# Patient Record
Sex: Female | Born: 1970 | Race: White | Hispanic: No | Marital: Married | Smoking: Former smoker
Health system: Southern US, Community
[De-identification: ages and names within clinical notes are randomized; demographics above are authoritative.]

## PROBLEM LIST (undated history)

## (undated) DIAGNOSIS — R928 Other abnormal and inconclusive findings on diagnostic imaging of breast: Secondary | ICD-10-CM

## (undated) DIAGNOSIS — F329 Major depressive disorder, single episode, unspecified: Secondary | ICD-10-CM

## (undated) DIAGNOSIS — K635 Polyp of colon: Secondary | ICD-10-CM

## (undated) DIAGNOSIS — N83209 Unspecified ovarian cyst, unspecified side: Secondary | ICD-10-CM

## (undated) DIAGNOSIS — E785 Hyperlipidemia, unspecified: Secondary | ICD-10-CM

## (undated) DIAGNOSIS — F32A Depression, unspecified: Secondary | ICD-10-CM

## (undated) HISTORY — DX: Unspecified ovarian cyst, unspecified side: N83.209

## (undated) HISTORY — PX: COLONOSCOPY: SHX174

## (undated) HISTORY — DX: Polyp of colon: K63.5

## (undated) HISTORY — DX: Other abnormal and inconclusive findings on diagnostic imaging of breast: R92.8

## (undated) HISTORY — DX: Depression, unspecified: F32.A

## (undated) HISTORY — DX: Major depressive disorder, single episode, unspecified: F32.9

## (undated) HISTORY — DX: Hyperlipidemia, unspecified: E78.5

---

## 2007-01-26 ENCOUNTER — Emergency Department: Payer: Self-pay | Admitting: Internal Medicine

## 2007-02-10 ENCOUNTER — Ambulatory Visit: Payer: Self-pay | Admitting: Unknown Physician Specialty

## 2007-12-19 ENCOUNTER — Ambulatory Visit: Payer: Self-pay | Admitting: Gastroenterology

## 2008-05-10 DIAGNOSIS — R928 Other abnormal and inconclusive findings on diagnostic imaging of breast: Secondary | ICD-10-CM

## 2008-05-10 HISTORY — DX: Other abnormal and inconclusive findings on diagnostic imaging of breast: R92.8

## 2008-11-27 ENCOUNTER — Ambulatory Visit: Payer: Self-pay | Admitting: Family Medicine

## 2010-12-09 ENCOUNTER — Ambulatory Visit: Payer: Self-pay | Admitting: Family Medicine

## 2016-03-05 ENCOUNTER — Ambulatory Visit (INDEPENDENT_AMBULATORY_CARE_PROVIDER_SITE_OTHER): Payer: 59

## 2016-03-05 ENCOUNTER — Ambulatory Visit
Admission: EM | Admit: 2016-03-05 | Discharge: 2016-03-05 | Disposition: A | Discharge status: Home / Self Care | Payer: 59 | Attending: Family Medicine | Admitting: Family Medicine

## 2016-03-05 ENCOUNTER — Encounter: Payer: Self-pay | Admitting: Emergency Medicine

## 2016-03-05 DIAGNOSIS — S92352A Displaced fracture of fifth metatarsal bone, left foot, initial encounter for closed fracture: Secondary | ICD-10-CM | POA: Diagnosis not present

## 2016-03-05 DIAGNOSIS — S92422A Displaced fracture of distal phalanx of left great toe, initial encounter for closed fracture: Secondary | ICD-10-CM | POA: Diagnosis not present

## 2016-03-05 NOTE — ED Triage Notes (Signed)
Patient c/o pain in her in left 1st toe after hitting her toe last night.

## 2016-03-05 NOTE — ED Provider Notes (Signed)
[facility]    CSN: [id number] Arrival date & time: 03/05/16  1000     History   Chief Complaint Chief Complaint  Patient presents with  . Toe Injury    HPI Shelby Odonnell is a 45 y.o. female.   Patient states that she tripped over her dog last night she was recently dog back to the house. Dr. Lennette Bihari she tripped and fell striking her left big toe on the brake steps. States the toes gotten so swollen and is bruised and very painful. This happened last night. Staff had broken toe before. She does smoke no other medical problems. She's had a rash when she took doxycycline before in the past. No pertinent family medical history pertaining to today's visit.   The history is provided by the patient. No language interpreter was used.  Leg Pain  Location:  Toe Time since incident:  1 day Injury: yes   Mechanism of injury: fall   Fall:    Fall occurred:  Running   Impact surface:  Stairs (brick step)   Entrapped after fall: no   Toe location:  L great toe Pain details:    Quality:  Aching   Radiates to:  Does not radiate   Severity:  Moderate   Onset quality:  Sudden   Timing:  Constant Chronicity:  New Dislocation: no   Foreign body present:  No foreign bodies Prior injury to area:  Yes   History reviewed. No pertinent past medical history.  There are no active problems to display for this patient.   Past Surgical History:  Procedure Laterality Date  . CESAREAN SECTION      OB History    No data available       Home Medications    Prior to Admission medications   Medication Sig Start Date End Date Taking? Authorizing Provider  norethindrone-ethinyl estradiol (JUNEL FE,GILDESS FE,LOESTRIN FE) 1-20 MG-MCG tablet Take 1 tablet by mouth daily.   Yes Historical Provider, MD  pravastatin (PRAVACHOL) 10 MG tablet Take 10 mg by mouth daily.   Yes Historical Provider, MD  venlafaxine (EFFEXOR) 75 MG tablet Take 75 mg by mouth 2 (two) times daily.    Yes Historical Provider, MD    Family History Family History  Problem Relation Age of Onset  . Colon cancer Father     Social History Social History  Substance Use Topics  . Smoking status: Current Every Day Smoker    Packs/day: 0.50    Types: Cigarettes  . Smokeless tobacco: Never Used  . Alcohol use Yes     Allergies   Tetracyclines & related   Review of Systems Review of Systems  Musculoskeletal: Positive for joint swelling and myalgias.  All other systems reviewed and are negative.    Physical Exam Triage Vital Signs ED Triage Vitals  Enc Vitals Group     BP 03/05/16 1014 132/65     Pulse Rate 03/05/16 1014 94     Resp 03/05/16 1014 16     Temp 03/05/16 1014 98.5 F (36.9 C)     Temp Source 03/05/16 1014 Oral     SpO2 03/05/16 1014 100 %     Weight 03/05/16 1012 170 lb (77.1 kg)     Height 03/05/16 1012 5\' 4"  (1.626 m)     Head Circumference --      Peak Flow --      Pain Score 03/05/16 1015 8     Pain Loc --  Pain Edu? --      Excl. in GC? --    No data found.   Updated Vital Signs BP 132/65 (BP Location: Left Arm)   Pulse 94   Temp 98.5 F (36.9 C) (Oral)   Resp 16   Ht 5\' 4"  (1.626 m)   Wt 170 lb (77.1 kg)   LMP 02/24/2016 (Exact Date) Comment: denies preg  SpO2 100%   BMI 29.18 kg/m   Visual Acuity Right Eye Distance:   Left Eye Distance:   Bilateral Distance:    Right Eye Near:   Left Eye Near:    Bilateral Near:     Physical Exam  Constitutional: She is oriented to person, place, and time. She appears well-developed and well-nourished.  HENT:  Head: Normocephalic and atraumatic.  Eyes: Pupils are equal, round, and reactive to light.  Neck: Neck supple.  Pulmonary/Chest: Breath sounds normal.  Musculoskeletal: Normal range of motion. She exhibits tenderness.  Neurological: She is alert and oriented to person, place, and time. She has normal reflexes. No cranial nerve deficit.  Skin: There is erythema.  Psychiatric: She  has a normal mood and affect.  Vitals reviewed.    UC Treatments / Results  Labs (all labs ordered are listed, but only abnormal results are displayed) Labs Reviewed - No data to display  EKG  EKG Interpretation None       Radiology Dg Toe Great Left  Result Date: 03/05/2016 CLINICAL DATA:  Left great toe injury. EXAM: LEFT GREAT TOE COMPARISON:  None. FINDINGS: Minimally displaced fracture noted along the lateral aspect of the base of the distal phalanx of the left great toe. Very subtle nondisplaced fracture at the medial base of the left first metatarsal cannot be completely excluded. Diffuse degenerative change. No radiopaque foreign body. IMPRESSION: 1. Minimally displaced fracture noted at the lateral base of the distal phalanx of the left great toe. 2. Very subtle nondisplaced fracture at the medial base of the left first metatarsal cannot be completely excluded. Electronically Signed   By: Maisie Fushomas  Register   On: 03/05/2016 11:00    Procedures Procedures (including critical care time)  Medications Ordered in UC Medications - No data to display   Initial Impression / Assessment and Plan / UC Course  I have reviewed the triage vital signs and the nursing notes.  Pertinent labs & imaging results that were available during my care of the patient were reviewed by me and considered in my medical decision making (see chart for details).  Clinical Course   X-ray L great toe. Patient with a distal phalanx fracture and a distal and a proximal metatarsal fracture. We'll have her wear a boot but he take both toes recommend following her podiatrist Dr. Ether GriffinsFowler. Work note given for today as well. Offers shot of Toradol she declines states she had Advil at home  Final Clinical Impressions(s) / UC Diagnoses   Final diagnoses:  Closed non-physeal fracture of fifth metatarsal bone of left foot, initial encounter  Displaced fracture of distal phalanx of left great toe, initial  encounter for closed fracture    New Prescriptions New Prescriptions   No medications on file     Note: This dictation was prepared with Dragon dictation along with smaller phrase technology. Any transcriptional errors that result from this process are unintentional.   Hassan RowanEugene Anyia Gierke, MD 03/05/16 1140

## 2016-03-09 ENCOUNTER — Telehealth: Payer: Self-pay

## 2016-03-09 NOTE — Telephone Encounter (Signed)
Courtesy call back completed today after patients visit at [facility]. Patient improved and will follow up with their PCP if symptoms continue or worsen.   

## 2016-07-26 ENCOUNTER — Encounter: Payer: Self-pay | Admitting: Emergency Medicine

## 2016-07-26 ENCOUNTER — Observation Stay
Admission: EM | Admit: 2016-07-26 | Discharge: 2016-07-28 | Disposition: A | Discharge status: Other Acute Inpatient Hospital | Payer: 59 | Attending: Internal Medicine | Admitting: Internal Medicine

## 2016-07-26 DIAGNOSIS — F17210 Nicotine dependence, cigarettes, uncomplicated: Secondary | ICD-10-CM | POA: Diagnosis not present

## 2016-07-26 DIAGNOSIS — F332 Major depressive disorder, recurrent severe without psychotic features: Secondary | ICD-10-CM | POA: Insufficient documentation

## 2016-07-26 DIAGNOSIS — F1010 Alcohol abuse, uncomplicated: Secondary | ICD-10-CM | POA: Diagnosis not present

## 2016-07-26 DIAGNOSIS — T43212A Poisoning by selective serotonin and norepinephrine reuptake inhibitors, intentional self-harm, initial encounter: Principal | ICD-10-CM | POA: Insufficient documentation

## 2016-07-26 DIAGNOSIS — E785 Hyperlipidemia, unspecified: Secondary | ICD-10-CM | POA: Diagnosis not present

## 2016-07-26 DIAGNOSIS — Z79899 Other long term (current) drug therapy: Secondary | ICD-10-CM | POA: Insufficient documentation

## 2016-07-26 DIAGNOSIS — F19921 Other psychoactive substance use, unspecified with intoxication with delirium: Secondary | ICD-10-CM | POA: Insufficient documentation

## 2016-07-26 DIAGNOSIS — R569 Unspecified convulsions: Secondary | ICD-10-CM | POA: Diagnosis not present

## 2016-07-26 DIAGNOSIS — F05 Delirium due to known physiological condition: Secondary | ICD-10-CM | POA: Insufficient documentation

## 2016-07-26 DIAGNOSIS — T43202A Poisoning by unspecified antidepressants, intentional self-harm, initial encounter: Secondary | ICD-10-CM

## 2016-07-26 DIAGNOSIS — T50902A Poisoning by unspecified drugs, medicaments and biological substances, intentional self-harm, initial encounter: Secondary | ICD-10-CM

## 2016-07-26 DIAGNOSIS — J45909 Unspecified asthma, uncomplicated: Secondary | ICD-10-CM | POA: Diagnosis not present

## 2016-07-26 LAB — CBC WITH DIFFERENTIAL/PLATELET
BASOS ABS: 0.1 10*3/uL (ref 0–0.1)
Basophils Relative: 1 %
EOS PCT: 1 %
Eosinophils Absolute: 0.1 10*3/uL (ref 0–0.7)
HCT: 37 % (ref 35.0–47.0)
Hemoglobin: 13.2 g/dL (ref 12.0–16.0)
Lymphocytes Relative: 26 %
Lymphs Abs: 2.9 10*3/uL (ref 1.0–3.6)
MCH: 32.7 pg (ref 26.0–34.0)
MCHC: 35.5 g/dL (ref 32.0–36.0)
MCV: 92 fL (ref 80.0–100.0)
MONO ABS: 1 10*3/uL — AB (ref 0.2–0.9)
Monocytes Relative: 9 %
NEUTROS ABS: 7 10*3/uL — AB (ref 1.4–6.5)
Neutrophils Relative %: 63 %
PLATELETS: 302 10*3/uL (ref 150–440)
RBC: 4.02 MIL/uL (ref 3.80–5.20)
RDW: 13.7 % (ref 11.5–14.5)
WBC: 11 10*3/uL (ref 3.6–11.0)

## 2016-07-26 LAB — URINE DRUG SCREEN, QUALITATIVE (ARMC ONLY)
Amphetamines, Ur Screen: NOT DETECTED
Barbiturates, Ur Screen: NOT DETECTED
Benzodiazepine, Ur Scrn: NOT DETECTED
CANNABINOID 50 NG, UR ~~LOC~~: NOT DETECTED
Cocaine Metabolite,Ur ~~LOC~~: NOT DETECTED
MDMA (ECSTASY) UR SCREEN: NOT DETECTED
Methadone Scn, Ur: NOT DETECTED
Opiate, Ur Screen: NOT DETECTED
PHENCYCLIDINE (PCP) UR S: NOT DETECTED
Tricyclic, Ur Screen: NOT DETECTED

## 2016-07-26 LAB — URINALYSIS, COMPLETE (UACMP) WITH MICROSCOPIC
BACTERIA UA: NONE SEEN
Bilirubin Urine: NEGATIVE
Glucose, UA: NEGATIVE mg/dL
Ketones, ur: NEGATIVE mg/dL
Leukocytes, UA: NEGATIVE
Nitrite: NEGATIVE
PROTEIN: NEGATIVE mg/dL
SQUAMOUS EPITHELIAL / LPF: NONE SEEN
Specific Gravity, Urine: 1.001 — ABNORMAL LOW (ref 1.005–1.030)
WBC UA: NONE SEEN WBC/hpf (ref 0–5)
pH: 7 (ref 5.0–8.0)

## 2016-07-26 LAB — COMPREHENSIVE METABOLIC PANEL
ALBUMIN: 3.9 g/dL (ref 3.5–5.0)
ALT: 24 U/L (ref 14–54)
AST: 26 U/L (ref 15–41)
Alkaline Phosphatase: 53 U/L (ref 38–126)
Anion gap: 7 (ref 5–15)
BUN: 10 mg/dL (ref 6–20)
CHLORIDE: 106 mmol/L (ref 101–111)
CO2: 24 mmol/L (ref 22–32)
Calcium: 8.5 mg/dL — ABNORMAL LOW (ref 8.9–10.3)
Creatinine, Ser: 0.73 mg/dL (ref 0.44–1.00)
GFR calc Af Amer: >60 mL/min (ref >60)
GFR calc non Af Amer: >60 mL/min (ref >60)
GLUCOSE: 88 mg/dL (ref 65–99)
POTASSIUM: 3.9 mmol/L (ref 3.5–5.1)
Sodium: 137 mmol/L (ref 135–145)
Total Bilirubin: 0.5 mg/dL (ref 0.3–1.2)
Total Protein: 6.6 g/dL (ref 6.5–8.1)

## 2016-07-26 LAB — ACETAMINOPHEN LEVEL

## 2016-07-26 LAB — SALICYLATE LEVEL

## 2016-07-26 NOTE — BH Assessment (Signed)
Assessment Note  Shelby Odonnell is an 46 y.o. female. Shelby Odonnell arrived to the ED by way of EMS.  She reports, "I took a bunch of pills, I have been really depressed".  Shelby Odonnell did not report a trigger for her depression.  She stated that she has been feeling depressed off and on for approximately 1 year.  She reports that she worries a lot, has crying spells, has feelings of feeling worthless, and uses a lot of negative self talk.  She reports that when she was taking the pills, she shared that she felt that she was doing everyone a favor by killing herself and not being a burden to everyone.  Shelby Odonnell denied having symptoms of anxiety.  She denied having auditory or visual hallucinations.  She denied having homicidal ideation or intent.  Currently Shelby Odonnell requires 24 hour medical observation due to the substances she ingested.   Diagnosis: Depression, SI  Past Medical History: History reviewed. No pertinent past medical history.  Past Surgical History:  Procedure Laterality Date  . CESAREAN SECTION      Family History:  Family History  Problem Relation Age of Onset  . Colon cancer Father     Social History:  reports that she has been smoking Cigarettes.  She has been smoking about 1.00 pack per day. She has never used smokeless tobacco. She reports that she drinks alcohol. She reports that she does not use drugs.  Additional Social History:  Alcohol / Drug Use History of alcohol / drug use?: Yes (Occassional use of alcohol) Substance #1 Name of Substance 1: Alcohol 1 - Age of First Use: 14 1 - Amount (size/oz): 2 mixed drinks 1 - Frequency: occassional 1 - Last Use / Amount: July 24, 2016  CIWA: CIWA-Ar BP: (!) 96/52 Pulse Rate: 92 COWS:    Allergies:  Allergies  Allergen Reactions  . Fluoxetine Other (See Comments)    Other Reaction: OTHER REACTION, onSet: 09/03/2009  . Tetracyclines & Related Rash    Home Medications:  (Not in a hospital  admission)  OB/GYN Status:  No LMP recorded.  General Assessment Data Location of Assessment: [facility] ED TTS Assessment: In system Is this a Tele or Face-to-Face Assessment?: Face-to-Face Is this an Initial Assessment or a Re-assessment for this encounter?: Initial Assessment Marital status: Married Shelby Odonnell Is patient pregnant?: No Pregnancy Status: No Living Arrangements: Spouse/significant other, Children Can pt return to current living arrangement?: Yes Admission Status: Involuntary Is patient capable of signing voluntary admission?: Yes Referral Source: Self/Family/Friend Insurance type: [facility]  Medical Screening Exam [facility] Walk-in ONLY) Medical Exam completed: Yes  Crisis Care Plan Living Arrangements: Spouse/significant other, Children Legal Guardian: Other: (Self) Name of Psychiatrist: None at this time Name of Therapist: None at this time  Education Status Is patient currently in school?: No Current Grade: n/a Highest grade of school patient has completed: 12th Name of school: [employer] manager person: n/a  Risk to self with the past 6 months Suicidal Ideation: Yes-Currently Present Has patient been a risk to self within the past 6 months prior to admission? : Yes Suicidal Intent: Yes-Currently Present Has patient had any suicidal intent within the past 6 months prior to admission? : Yes Is patient at risk for suicide?:  (Currently in the hospital) Suicidal Plan?: No Has patient had any suicidal plan within the past 6 months prior to admission? : Yes Access to Means: Yes Specify Access to Suicidal Means: Use of medication she has  What has been your use of drugs/alcohol within the last 12 months?: occassional use of alcohol Previous Attempts/Gestures: No How many times?: 0 Other Self Harm Risks: denied Triggers for Past Attempts: None known Intentional Self Injurious Behavior: None (history of cutting when she was a teenager) Family Suicide  History: No Recent stressful life event(s):  (denied) Persecutory voices/beliefs?: No Depression: Yes Depression Symptoms: Despondent, Tearfulness, Feeling worthless/self pity Substance abuse history and/or treatment for substance abuse?: No Suicide prevention information given to non-admitted patients: Not applicable  Risk to Others within the past 6 months Homicidal Ideation: No Does patient have any lifetime risk of violence toward others beyond the six months prior to admission? : No Thoughts of Harm to Others: No Current Homicidal Intent: No Current Homicidal Plan: No Access to Homicidal Means: No Identified Victim: None identified History of harm to others?: No Assessment of Violence: None Noted Violent Behavior Description: denied Does patient have access to weapons?: No Criminal Charges Pending?: No Does patient have a court date: No Is patient on probation?: No  Psychosis Hallucinations: None noted Delusions: None noted  Mental Status Report Appearance/Hygiene: In scrubs Eye Contact: Fair Motor Activity: Unremarkable Speech: Logical/coherent, Soft Level of Consciousness: Alert Mood: Depressed Affect: Sad Anxiety Level: None Thought Processes: Coherent Judgement: Partial Orientation: Person, Place, Time, Situation Obsessive Compulsive Thoughts/Behaviors: None  Cognitive Functioning Concentration: Decreased Memory: Recent Intact IQ: Average Insight: Poor Impulse Control: Poor Appetite: Fair Sleep: No Change Vegetative Symptoms: None  ADLScreening [facility]) Patient's cognitive ability adequate to safely complete daily activities?: Yes Patient able to express need for assistance with ADLs?: Yes Independently performs ADLs?: Yes (appropriate for developmental age)  Prior Inpatient Therapy Prior Inpatient Therapy: No Prior Therapy Dates: n/a Prior Therapy Facilty/Provider(s): n/a Reason for Treatment: n/a  Prior Outpatient  Therapy Prior Outpatient Therapy: No Prior Therapy Dates: n/a Prior Therapy Facilty/Provider(s): n/a Reason for Treatment: n/a Does patient have an ACCT team?: No Does patient have Intensive In-House Services?  : No Does patient have [facility] services? : No Does patient have P4CC services?: No  ADL Screening (condition at time of admission) Patient's cognitive ability adequate to safely complete daily activities?: Yes Patient able to express need for assistance with ADLs?: Yes Independently performs ADLs?: Yes (appropriate for developmental age)       Abuse/Neglect Assessment (Assessment to be complete while patient is alone) Physical Abuse: Denies Verbal Abuse: Denies Sexual Abuse: Denies Exploitation of patient/patient's resources: Denies Self-Neglect: Denies     Merchant navy officerAdvance Directives (For Healthcare) Does Patient Have a Medical Advance Directive?: No    Additional Information 1:1 In Past 12 Months?: No CIRT Risk: No Elopement Risk: No Does patient have medical clearance?: No     Disposition:  Disposition Initial Assessment Completed for this Encounter: Yes Disposition of Patient: Inpatient treatment program  On Site Evaluation by:   Reviewed with Physician:    Justice DeedsKeisha Carmellia Kreisler 07/26/2016 11:47 PM

## 2016-07-26 NOTE — ED Notes (Signed)
[employer]

## 2016-07-26 NOTE — ED Notes (Signed)
Removed silver colored wedding band and silver colored brow piercing.  Gave to pts husband to take home per pts request.  Also gave husband a silver anklet with a wing charm, nose ring, and pts meds (4 bottles of Venlafaxine) to take home also per pts request.  Pt brought to room 20 via wheelchair.

## 2016-07-26 NOTE — ED Provider Notes (Signed)
[facility] Emergency Department Provider Note  ____________________________________________   First MD Initiated Contact with Patient 07/26/16 2144     (approximate)  I have reviewed the triage vital signs and the nursing notes.   HISTORY  Chief Complaint Drug Overdose   HPI Ronnie DossJudy G Krouse is a 46 y.o. female with a history of a "mood disorder" who is presenting to the emergency department with an intentional overdose. About an hour 50 minutes ago she took several handfuls of 150 mg extended release Effexor. She says that she did this on purpose and says that she would rather not go into the details of why she did at this time. She denies any other drugs or drinking. Says she has never had a suicide attempt in the past. Says that her husband called the police after the incident.   History reviewed. No pertinent past medical history.  There are no active problems to display for this patient.   Past Surgical History:  Procedure Laterality Date  . CESAREAN SECTION      Prior to Admission medications   Medication Sig Start Date End Date Taking? Authorizing Provider  albuterol (PROVENTIL HFA;VENTOLIN HFA) 108 (90 Base) MCG/ACT inhaler Inhale 2 puffs into the lungs daily as needed. 02/19/16 02/18/17 Yes Historical Provider, MD  ascorbic acid (VITAMIN C) 250 MG tablet Take 250 mg by mouth daily.   Yes Historical Provider, MD  norethindrone-ethinyl estradiol (JUNEL FE,GILDESS FE,LOESTRIN FE) 1-20 MG-MCG tablet Take 1 tablet by mouth daily.   Yes Historical Provider, MD  Omega-3 Fatty Acids (FISH OIL PO) Take 1 capsule by mouth daily.   Yes Historical Provider, MD  simvastatin (ZOCOR) 20 MG tablet Take 20 mg by mouth daily. 05/31/16  Yes Historical Provider, MD  venlafaxine (EFFEXOR) 75 MG tablet Take 75 mg by mouth 2 (two) times daily.   Yes Historical Provider, MD    Allergies Fluoxetine and Tetracyclines & related  Family History  Problem Relation Age  of Onset  . Colon cancer Father     Social History Social History  Substance Use Topics  . Smoking status: Current Every Day Smoker    Packs/day: 1.00    Types: Cigarettes  . Smokeless tobacco: Never Used  . Alcohol use Yes    Review of Systems Constitutional: No fever/chills Eyes: No visual changes. ENT: No sore throat. Cardiovascular: Denies chest pain. Respiratory: Denies shortness of breath. Gastrointestinal: No abdominal pain.  No nausea, no vomiting.  No diarrhea.  No constipation. Genitourinary: Negative for dysuria. Musculoskeletal: Negative for back pain. Skin: Negative for rash. Neurological: Negative for headaches, focal weakness or numbness.  10-point ROS otherwise negative.  ____________________________________________   PHYSICAL EXAM:  VITAL SIGNS: ED Triage Vitals [07/26/16 2139]  Enc Vitals Group     BP (!) 116/59     Pulse Rate 96     Resp 20     Temp 98.3 F (36.8 C)     Temp Source Oral     SpO2 100 %     Weight 167 lb (75.8 kg)     Height 5\' 5"  (1.651 m)     Head Circumference      Peak Flow      Pain Score      Pain Loc      Pain Edu?      Excl. in GC?     Constitutional: Alert and oriented. in no acute distress. Eyes: Conjunctivae are normal. PERRL. EOMI. Head: Atraumatic. Nose: No congestion/rhinnorhea. Mouth/Throat:  Mucous membranes are moist.   Neck: No stridor.   Cardiovascular: Normal rate, regular rhythm. Grossly normal heart sounds.   Respiratory: Normal respiratory effort.  No retractions. Lungs CTAB. Gastrointestinal: Soft and nontender. No distention.  Musculoskeletal: No lower extremity tenderness nor edema.  No joint effusions. Neurologic:  Normal speech and language. No gross focal neurologic deficits are appreciated.  Skin:  Skin is warm, dry and intact. No rash noted. Psychiatric: Tearful.   ____________________________________________   LABS (all labs ordered are listed, but only abnormal results are  displayed)  Labs Reviewed  ACETAMINOPHEN LEVEL - Abnormal; Notable for the following:       Result Value   Acetaminophen (Tylenol), Serum <10 (*)    All other components within normal limits  CBC WITH DIFFERENTIAL/PLATELET - Abnormal; Notable for the following:    Neutro Abs 7.0 (*)    Monocytes Absolute 1.0 (*)    All other components within normal limits  COMPREHENSIVE METABOLIC PANEL - Abnormal; Notable for the following:    Calcium 8.5 (*)    All other components within normal limits  URINALYSIS, COMPLETE (UACMP) WITH MICROSCOPIC - Abnormal; Notable for the following:    Color, Urine COLORLESS (*)    APPearance CLEAR (*)    Specific Gravity, Urine 1.001 (*)    Hgb urine dipstick SMALL (*)    All other components within normal limits  SALICYLATE LEVEL  URINE DRUG SCREEN, QUALITATIVE ([facility] ONLY)   ____________________________________________  EKG  ED ECG REPORT I, Arelia Longest, the attending physician, personally viewed and interpreted this ECG.   Date: 07/26/2016  EKG Time: 1132  Rate: 92  Rhythm: normal sinus rhythm  Axis: Normal  Intervals:none  ST&T Change: No ST segment elevation or depression. No abnormal T-wave inversion.  ____________________________________________  RADIOLOGY   ____________________________________________   PROCEDURES  Procedure(s) performed:   Procedures  Critical Care performed:   ____________________________________________   INITIAL IMPRESSION / ASSESSMENT AND PLAN / ED COURSE  Pertinent labs & imaging results that were available during my care of the patient were reviewed by me and considered in my medical decision making (see chart for details).  ----------------------------------------- 945 PM on 07/26/2016 ----------------------------------------- Discussed case with the [facility] [place], who recommends monitoring as well as every 6 EKGs. She recommends bicarbonate boluses for a prolonged QRS or  prolonged QT. Patient to be involuntarily committed. Patient is aware of this plan and is cooperating at this time. Patient ingested the medications are verapamil ago. Poison control did not recommend activated charcoal.  ----------------------------------------------------------- ----------------------------------------- 11:34 PM on 07/26/2016 -----------------------------------------   Patient still mentating awake and alert. Satisfactory vital signs at this time. She'll be transferred to the quad area of the emergency department and will be kept on the cardiac monitor. Signed out to Dr. Zenda Alpers for continued monitoring of the patient's condition.     ____________________________________________   FINAL CLINICAL IMPRESSION(S) / ED DIAGNOSES  Intentional overdose. Venlafaxine overdose.    NEW MEDICATIONS STARTED DURING THIS VISIT:  New Prescriptions   No medications on file     Note:  This document was prepared using Dragon voice recognition software and may include unintentional dictation errors.    Myrna Blazer, MD 07/26/16 [phone]

## 2016-07-26 NOTE — ED Triage Notes (Signed)
Pt presents to ED 05 c/o intentional drug overdose of unknown amount of anti-depressant medication; per EMS the pt's initial BP was 141/72, with HR of 108-110; on the way to ED HR and BP were 122/70 and HR in low 90s; pt states "not answering that" when asked about suicidal ideation. Per EMS, pt was locked in a room for about half an hour and he had to break the door down to get to her; pt is awake, alert and oriented x4; pts VS at this time are HR 96, O2 100% on RA, RR 20, BP 116/59, temp 98.3

## 2016-07-27 ENCOUNTER — Observation Stay: Payer: 59

## 2016-07-27 ENCOUNTER — Encounter: Payer: Self-pay | Admitting: Radiology

## 2016-07-27 DIAGNOSIS — F332 Major depressive disorder, recurrent severe without psychotic features: Secondary | ICD-10-CM | POA: Diagnosis not present

## 2016-07-27 DIAGNOSIS — T43202A Poisoning by unspecified antidepressants, intentional self-harm, initial encounter: Secondary | ICD-10-CM

## 2016-07-27 DIAGNOSIS — T50902A Poisoning by unspecified drugs, medicaments and biological substances, intentional self-harm, initial encounter: Secondary | ICD-10-CM

## 2016-07-27 DIAGNOSIS — F19921 Other psychoactive substance use, unspecified with intoxication with delirium: Secondary | ICD-10-CM

## 2016-07-27 DIAGNOSIS — R569 Unspecified convulsions: Secondary | ICD-10-CM

## 2016-07-27 LAB — CBC
HCT: 34.3 % — ABNORMAL LOW (ref 35.0–47.0)
Hemoglobin: 12.1 g/dL (ref 12.0–16.0)
MCH: 33 pg (ref 26.0–34.0)
MCHC: 35.3 g/dL (ref 32.0–36.0)
MCV: 93.4 fL (ref 80.0–100.0)
PLATELETS: 271 10*3/uL (ref 150–440)
RBC: 3.67 MIL/uL — AB (ref 3.80–5.20)
RDW: 13.8 % (ref 11.5–14.5)
WBC: 11.2 10*3/uL — ABNORMAL HIGH (ref 3.6–11.0)

## 2016-07-27 LAB — BASIC METABOLIC PANEL
Anion gap: 6 (ref 5–15)
BUN: 10 mg/dL (ref 6–20)
CALCIUM: 8 mg/dL — AB (ref 8.9–10.3)
CHLORIDE: 110 mmol/L (ref 101–111)
CO2: 23 mmol/L (ref 22–32)
Creatinine, Ser: 0.56 mg/dL (ref 0.44–1.00)
GFR calc Af Amer: >60 mL/min (ref >60)
GFR calc non Af Amer: >60 mL/min (ref >60)
GLUCOSE: 68 mg/dL (ref 65–99)
POTASSIUM: 3.7 mmol/L (ref 3.5–5.1)
SODIUM: 139 mmol/L (ref 135–145)

## 2016-07-27 LAB — MAGNESIUM: MAGNESIUM: 1.9 mg/dL (ref 1.7–2.4)

## 2016-07-27 LAB — PHOSPHORUS: Phosphorus: 2.7 mg/dL (ref 2.5–4.6)

## 2016-07-27 LAB — TSH: TSH: 4.715 u[IU]/mL — ABNORMAL HIGH (ref 0.350–4.500)

## 2016-07-27 MED ORDER — LORAZEPAM 2 MG/ML IJ SOLN
2.0000 mg | Freq: Once | INTRAMUSCULAR | Status: AC
  Administered 2016-07-27: 2 mg via INTRAVENOUS

## 2016-07-27 MED ORDER — CHLORHEXIDINE GLUCONATE 0.12 % MT SOLN
15.0000 mL | Freq: Two times a day (BID) | OROMUCOSAL | Status: DC
  Administered 2016-07-27 – 2016-07-28 (×3): 15 mL via OROMUCOSAL
  Filled 2016-07-27 (×3): qty 15

## 2016-07-27 MED ORDER — ONDANSETRON HCL 4 MG PO TABS: 4.0000 mg | ORAL_TABLET | Freq: Four times a day (QID) | ORAL | Status: DC | PRN

## 2016-07-27 MED ORDER — SODIUM CHLORIDE 0.9 % IV SOLN
INTRAVENOUS | Status: DC
  Administered 2016-07-27 – 2016-07-28 (×4): via INTRAVENOUS

## 2016-07-27 MED ORDER — ORAL CARE MOUTH RINSE
15.0000 mL | Freq: Two times a day (BID) | OROMUCOSAL | Status: DC
  Administered 2016-07-27 – 2016-07-28 (×3): 15 mL via OROMUCOSAL

## 2016-07-27 MED ORDER — ACETAMINOPHEN 650 MG RE SUPP: 650.0000 mg | Freq: Four times a day (QID) | RECTAL | Status: DC | PRN

## 2016-07-27 MED ORDER — ACETAMINOPHEN 325 MG PO TABS: 650.0000 mg | ORAL_TABLET | Freq: Four times a day (QID) | ORAL | Status: DC | PRN

## 2016-07-27 MED ORDER — VITAMIN C 500 MG PO TABS
250.0000 mg | ORAL_TABLET | Freq: Every day | ORAL | Status: DC
  Administered 2016-07-27 – 2016-07-28 (×2): 250 mg via ORAL
  Filled 2016-07-27 (×2): qty 1

## 2016-07-27 MED ORDER — PNEUMOCOCCAL VAC POLYVALENT 25 MCG/0.5ML IJ INJ
0.5000 mL | INJECTION | INTRAMUSCULAR | Status: AC
  Administered 2016-07-28: 0.5 mL via INTRAMUSCULAR
  Filled 2016-07-27: qty 0.5

## 2016-07-27 MED ORDER — SENNOSIDES-DOCUSATE SODIUM 8.6-50 MG PO TABS: 1.0000 | ORAL_TABLET | Freq: Every evening | ORAL | Status: DC | PRN

## 2016-07-27 MED ORDER — IOPAMIDOL (ISOVUE-300) INJECTION 61%
75.0000 mL | Freq: Once | INTRAVENOUS | Status: AC | PRN
  Administered 2016-07-27: 75 mL via INTRAVENOUS

## 2016-07-27 MED ORDER — SIMVASTATIN 20 MG PO TABS
20.0000 mg | ORAL_TABLET | Freq: Every day | ORAL | Status: DC
  Administered 2016-07-27 – 2016-07-28 (×2): 20 mg via ORAL
  Filled 2016-07-27 (×2): qty 1

## 2016-07-27 MED ORDER — ALBUTEROL SULFATE (2.5 MG/3ML) 0.083% IN NEBU: 3.0000 mL | INHALATION_SOLUTION | Freq: Every day | RESPIRATORY_TRACT | Status: DC | PRN

## 2016-07-27 MED ORDER — LORAZEPAM 2 MG/ML IJ SOLN
2.0000 mg | INTRAMUSCULAR | Status: DC | PRN
  Administered 2016-07-27: 2 mg via INTRAVENOUS
  Filled 2016-07-27: qty 1

## 2016-07-27 MED ORDER — ENOXAPARIN SODIUM 40 MG/0.4ML ~~LOC~~ SOLN
40.0000 mg | SUBCUTANEOUS | Status: DC
  Administered 2016-07-27: 20:00:00 40 mg via SUBCUTANEOUS
  Filled 2016-07-27: qty 0.4

## 2016-07-27 MED ORDER — ONDANSETRON HCL 4 MG/2ML IJ SOLN
4.0000 mg | Freq: Four times a day (QID) | INTRAMUSCULAR | Status: DC | PRN
  Administered 2016-07-27 (×2): 4 mg via INTRAVENOUS
  Filled 2016-07-27 (×2): qty 2

## 2016-07-27 MED ORDER — SODIUM CHLORIDE 0.9% FLUSH: 3.0000 mL | Freq: Two times a day (BID) | INTRAVENOUS | Status: DC

## 2016-07-27 MED ORDER — OMEGA-3-ACID ETHYL ESTERS 1 G PO CAPS
1.0000 g | ORAL_CAPSULE | Freq: Every day | ORAL | Status: DC
  Administered 2016-07-27 – 2016-07-28 (×2): 1 g via ORAL
  Filled 2016-07-27 (×2): qty 1

## 2016-07-27 MED ORDER — SODIUM CHLORIDE 0.9 % IV BOLUS (SEPSIS)
1000.0000 mL | Freq: Once | INTRAVENOUS | Status: AC
  Administered 2016-07-27: 1000 mL via INTRAVENOUS

## 2016-07-27 NOTE — ED Provider Notes (Signed)
-----------------------------------------   3:33 AM on 07/27/2016 -----------------------------------------   Blood pressure (!) 99/53, pulse 88, temperature 98.3 F (36.8 C), temperature source Oral, resp. rate 20, height 5\' 5"  (1.651 m), weight 167 lb (75.8 kg), SpO2 98 %.  Assuming care from Dr. Pershing ProudSchaevitz.  In short, Shelby Odonnell is a 46 y.o. female with a chief complaint of Drug Overdose .  Refer to the original H&P for additional details.  The current plan of care is to monitor the patient on the cardiac monitor for 24 hours until psych can see the patient .  In the emergency department the patient had a seizure. It did not last very long time. She did receive 2 mg of IM Ativan but she did become tachycardic. Her repeat EKG did not show a prolonged QTC or widened QRS. I will give the patient a liter of normal saline and I will admit her to the hospitalist service for medical clearance for a drug overdose.     ED ECG REPORT I, Rebecka ApleyWebster,  Allison P, the attending physician, personally viewed and interpreted this ECG.   Date: 07/27/2016  EKG Time: 322  Rate: 117  Rhythm: sinus tachycardia  Axis: normal  Intervals:none  ST&T Change: none    Rebecka ApleyAllison P Webster, MD 07/27/16 [phone]

## 2016-07-27 NOTE — Progress Notes (Signed)
[facility] -  at [facility]   PATIENT NAME: Shelby Odonnell    MR#:  [id number]  DATE OF BIRTH:  1970-07-22  SUBJECTIVE:  CHIEF COMPLAINT:   Chief Complaint  Patient presents with  . Drug Overdose   - RN called me after patient had a seizure episode this morning - patient admitted with effexor overdose. Alert but confused- post ictal state now  REVIEW OF SYSTEMS:  Review of Systems  Unable to perform ROS: Mental status change    DRUG ALLERGIES:   Allergies  Allergen Reactions  . Fluoxetine Other (See Comments)    Other Reaction: OTHER REACTION, onSet: 09/03/2009  . Tetracyclines & Related Rash    VITALS:  Blood pressure (!) 98/55, pulse (!) 109, temperature 99.1 F (37.3 C), temperature source Oral, resp. rate 20, height 5\' 5"  (1.651 m), weight 75.8 kg (167 lb), SpO2 95 %.  PHYSICAL EXAMINATION:  Physical Exam  GENERAL:  46 y.o.-year-old patient lying in the bed with no acute distress. Appears confused EYES: Pupils equal, round, reactive to light and accommodation. No scleral icterus. Extraocular muscles intact.  HEENT: Head atraumatic, normocephalic. Oropharynx and nasopharynx clear.  NECK:  Supple, no jugular venous distention. No thyroid enlargement, no tenderness.  LUNGS: Normal breath sounds bilaterally, no wheezing, rales,rhonchi or crepitation. No use of accessory muscles of respiration. Decreased bibasilar breath sounds CARDIOVASCULAR: S1, S2 normal. No murmurs, rubs, or gallops.  ABDOMEN: Soft, nontender, nondistended. Bowel sounds present. No organomegaly or mass.  EXTREMITIES: No pedal edema, cyanosis, or clubbing.  NEUROLOGIC: Cranial nerves II through XII are intact. Muscle strength 5/5 in all extremities. Sensation intact. Gait not checked. Able to move all extremities in bed PSYCHIATRIC: The patient is alert , confused.  SKIN: No obvious rash, lesion, or ulcer.    LABORATORY PANEL:   CBC  Recent Labs Lab 07/27/16 0550  WBC  11.2*  HGB 12.1  HCT 34.3*  PLT 271   ------------------------------------------------------------------------------------------------------------------  Chemistries   Recent Labs Lab 07/26/16 2143 07/27/16 0550  NA 137 139  K 3.9 3.7  CL 106 110  CO2 24 23  GLUCOSE 88 68  BUN 10 10  CREATININE 0.73 0.56  CALCIUM 8.5* 8.0*  AST 26  --   ALT 24  --   ALKPHOS 53  --   BILITOT 0.5  --    ------------------------------------------------------------------------------------------------------------------  Cardiac Enzymes No results for input(s): TROPONINI in the last 168 hours. ------------------------------------------------------------------------------------------------------------------  RADIOLOGY:  No results found.  EKG:   Orders placed or performed during the hospital encounter of 07/26/16  . EKG 12-Lead  . EKG 12-Lead  . ED EKG  . ED EKG  . ED EKG  . ED EKG  . ED EKG  . ED EKG  . ED EKG  . EKG 12-Lead  . EKG 12-Lead  . EKG 12-Lead  . EKG 12-Lead  . EKG 12-Lead  . EKG 12-Lead    ASSESSMENT AND PLAN:   46 y/o M with PMH of Depression on effexor admitted with an intentional overdose of 150mg  effexor extended release tabs.  #1 Drug overdose- effexor overdose- extended release tabs - monitor for seziures and cardiac arrythmias per posion control- ingestion was around 9PM last night- needs 24hrs to clear from system and also need to monitor for seizures until then Continue tele monitoring EKG now- watch for QRS widening and QT prolongation, if QRS>156ms- order bicarb push and repeat EKG to see the response - major depression with drug overdose- psych  consulted  #2 Seizures- secondary to effexor overdose- Watch for QRS widening which predisposes the seizures Should improve after 24hrs of ingestion per poison control Hold off on anti epileptics, continue ativan prn Neurology consulted  #3 Asthma-stable, prn inhaler  #4 DVT Prophylaxis- on  lovenox     All the records are reviewed and case discussed with Care Management/Social Workerr. Management plans discussed with the patient, family and they are in agreement.  CODE STATUS: Full Code  TOTAL TIME TAKING CARE OF THIS PATIENT: 39 minutes.   POSSIBLE D/C IN 1 DAY, DEPENDING ON CLINICAL CONDITION.   Macedonio Scallon M.D on 07/27/2016 at 9:51 AM  Between 7am to 6pm - Pager - [phone]  After 6pm go to [email or web address] - Social research officer, governmentpassword [email or web address]  [facility] [place] [facility]  Office  [phone]  CC: Primary care physician; [facility]

## 2016-07-27 NOTE — Plan of Care (Signed)
Problem: Activity: Goal: Risk for activity intolerance will decrease Outcome: Progressing Seizure pads remain in place on side rails x 4

## 2016-07-27 NOTE — ED Notes (Signed)
Pt had seizure, generalize, jerking, twitching, retracted, staring, stiff, incontinent, drools, lasted about 3 minutes with postictal.

## 2016-07-27 NOTE — Consult Note (Signed)
Reason for Consult:Seizure Referring Physician: Nemiah Commander  CC: Seizure  HPI: Shelby Odonnell is an 46 y.o. female who is unable to remember enough to provide an accurate history.  All history obtained from the chart and husband.  Patient presented to the emergency room after she overdosed with 3 bottles of venlafaxine pills. Patient said she felt depressed and wanted to kill herself. She lacks energy, and has thoughts of hurting herself. Patient involuntarily committed in the emergency room. Patient was awake, alert and oriented to time place and person at admission.  Noted to have a seizure in the ED and has had two further since admission.  Seizures described as generalized tonic-clonic.  Last seizure was about 0830 this morning.     History reviewed. No pertinent past medical history.  Past Surgical History:  Procedure Laterality Date  . CESAREAN SECTION      Family History  Problem Relation Age of Onset  . Colon cancer Father     Social History:  reports that she has been smoking Cigarettes.  She has been smoking about 1.00 pack per day. She has never used smokeless tobacco. She reports that she drinks alcohol. She reports that she does not use drugs.  Allergies  Allergen Reactions  . Fluoxetine Other (See Comments)    Other Reaction: OTHER REACTION, onSet: 09/03/2009  . Tetracyclines & Related Rash    Medications:  I have reviewed the patient's current medications. Prior to Admission:  Prescriptions Prior to Admission  Medication Sig Dispense Refill Last Dose  . albuterol (PROVENTIL HFA;VENTOLIN HFA) 108 (90 Base) MCG/ACT inhaler Inhale 2 puffs into the lungs daily as needed.     Marland Kitchen ascorbic acid (VITAMIN C) 250 MG tablet Take 250 mg by mouth daily.     . norethindrone-ethinyl estradiol (JUNEL FE,GILDESS FE,LOESTRIN FE) 1-20 MG-MCG tablet Take 1 tablet by mouth daily.     . Omega-3 Fatty Acids (FISH OIL PO) Take 1 capsule by mouth daily.     . simvastatin (ZOCOR) 20 MG tablet  Take 20 mg by mouth daily.     Marland Kitchen venlafaxine (EFFEXOR) 75 MG tablet Take 75 mg by mouth 2 (two) times daily.      Scheduled: . chlorhexidine  15 mL Mouth Rinse BID  . enoxaparin (LOVENOX) injection  40 mg Subcutaneous Q24H  . mouth rinse  15 mL Mouth Rinse q12n4p  . omega-3 acid ethyl esters  1 g Oral Daily  . [START ON 07/28/2016] pneumococcal 23 valent vaccine  0.5 mL Intramuscular Tomorrow-1000  . simvastatin  20 mg Oral Daily  . ascorbic acid  250 mg Oral Daily    ROS: History obtained from the patient  General ROS: negative for - chills, fatigue, fever, night sweats, weight gain or weight loss Psychological ROS: poor memory, feels as if in a dream state Ophthalmic ROS: negative for - blurry vision, double vision, eye pain or loss of vision ENT ROS: negative for - epistaxis, nasal discharge, oral lesions, sore throat, tinnitus or vertigo Allergy and Immunology ROS: negative for - hives or itchy/watery eyes Hematological and Lymphatic ROS: negative for - bleeding problems, bruising or swollen lymph nodes Endocrine ROS: negative for - galactorrhea, hair pattern changes, polydipsia/polyuria or temperature intolerance Respiratory ROS: negative for - cough, hemoptysis, shortness of breath or wheezing Cardiovascular ROS: negative for - chest pain, dyspnea on exertion, edema or irregular heartbeat Gastrointestinal ROS: negative for - abdominal pain, diarrhea, hematemesis, nausea/vomiting or stool incontinence Genito-Urinary ROS: negative for - dysuria, hematuria, incontinence  or urinary frequency/urgency Musculoskeletal ROS: negative for - joint swelling or muscular weakness Neurological ROS: as noted in HPI Dermatological ROS: negative for rash and skin lesion changes  Physical Examination: Blood pressure (!) 98/55, pulse (!) 109, temperature 99.1 F (37.3 C), temperature source Oral, resp. rate 20, height 5\' 5"  (1.651 m), weight 75.8 kg (167 lb), SpO2 95 %.  HEENT-  Normocephalic, no  lesions, without obvious abnormality.  Normal external eye and conjunctiva.  Normal TM's bilaterally.  Normal auditory canals and external ears. Normal external nose, mucus membranes and septum.  Normal pharynx.  Right tongue laceration. Cardiovascular- S1, S2 normal, pulses palpable throughout   Lungs- chest clear, no wheezing, rales, normal symmetric air entry Abdomen- soft, non-tender; bowel sounds normal; no masses,  no organomegaly Extremities- no edema Lymph-no adenopathy palpable Musculoskeletal-no joint tenderness, deformity or swelling Skin-warm and dry, no hyperpigmentation, vitiligo, or suspicious lesions  Neurological Examination   Mental Status: Alert, confused.  Word finding difficulties.  Able to follow 3 step commands with some minor reinforcement. Cranial Nerves: II: Discs flat bilaterally; Visual fields grossly normal, pupils equal, round, reactive to light and accommodation III,IV, VI: ptosis not present, extra-ocular motions intact bilaterally with some nystagmus noted.   V,VII: smile symmetric, facial light touch sensation normal bilaterally VIII: hearing normal bilaterally IX,X: gag reflex present XI: bilateral shoulder shrug XII: midline tongue extension Motor: Right : Upper extremity   5/5    Left:     Upper extremity   5/5  Lower extremity   5/5     Lower extremity   5/5 Tone and bulk:normal tone throughout; no atrophy noted.  Bilateral tremor noted in all extremities Sensory: Pinprick and light touch intact throughout, bilaterally Deep Tendon Reflexes: 3+ and symmetric throughout Plantars: Right: downgoing   Left: downgoing Cerebellar: Normal finger-to-nose and normal heel-to-shin testing bilaterally Gait: not tested due to safety concerns    Laboratory Studies:   Basic Metabolic Panel:  Recent Labs Lab 07/26/16 2143 07/27/16 0550  NA 137 139  K 3.9 3.7  CL 106 110  CO2 24 23  GLUCOSE 88 68  BUN 10 10  CREATININE 0.73 0.56  CALCIUM 8.5* 8.0*     Liver Function Tests:  Recent Labs Lab 07/26/16 2143  AST 26  ALT 24  ALKPHOS 53  BILITOT 0.5  PROT 6.6  ALBUMIN 3.9   No results for input(s): LIPASE, AMYLASE in the last 168 hours. No results for input(s): AMMONIA in the last 168 hours.  CBC:  Recent Labs Lab 07/26/16 2143 07/27/16 0550  WBC 11.0 11.2*  NEUTROABS 7.0*  --   HGB 13.2 12.1  HCT 37.0 34.3*  MCV 92.0 93.4  PLT 302 271    Cardiac Enzymes: No results for input(s): CKTOTAL, CKMB, CKMBINDEX, TROPONINI in the last 168 hours.  BNP: Invalid input(s): POCBNP  CBG: No results for input(s): GLUCAP in the last 168 hours.  Microbiology: No results found for this or any previous visit.  Coagulation Studies: No results for input(s): LABPROT, INR in the last 72 hours.  Urinalysis:  Recent Labs Lab 07/26/16 2143  COLORURINE COLORLESS*  LABSPEC 1.001*  PHURINE 7.0  GLUCOSEU NEGATIVE  HGBUR SMALL*  BILIRUBINUR NEGATIVE  KETONESUR NEGATIVE  PROTEINUR NEGATIVE  NITRITE NEGATIVE  LEUKOCYTESUR NEGATIVE    Lipid Panel:  No results found for: CHOL, TRIG, HDL, CHOLHDL, VLDL, LDLCALC  HgbA1C: No results found for: HGBA1C  Urine Drug Screen:     Component Value Date/Time   LABOPIA NONE  DETECTED 07/26/2016 2143   COCAINSCRNUR NONE DETECTED 07/26/2016 2143   LABBENZ NONE DETECTED 07/26/2016 2143   AMPHETMU NONE DETECTED 07/26/2016 2143   THCU NONE DETECTED 07/26/2016 2143   LABBARB NONE DETECTED 07/26/2016 2143    Alcohol Level: No results for input(s): ETH in the last 168 hours.  Other results: EKG: sinus tachycardia at 108 bpm.  Imaging: No results found.   Assessment/Plan: 46 year old female presenting after a Venlafaxine overdose.  Has had three seizures since admission.  Venlafaxine can clearly be the etiology for her seizures particularly in the high doses.  Would consider seizures provoked at this time.  Would look for other possible predisposing factors as well.     Recommendations: 1.  Head CT with and without contrast 2.  Serum magnesium and phosphorus 3.  EEG 4.  Ativan prn seizure activity 5.  Seizure precautions  Thana Farr, MD Neurology [phone] 07/27/2016, 1:12 PM

## 2016-07-27 NOTE — ED Notes (Addendum)
Received a call from Onalee Huaavid from poison control, checking on pt; asked for vital signs, urine drug screen, ASA/tylenol levels, and general status of the pt. Information provided.

## 2016-07-27 NOTE — ED Notes (Signed)
Changed pt into clean scrubs, clean bed sheets, and a clean blanket. Pt resting at this time.

## 2016-07-27 NOTE — H&P (Addendum)
[facility] - [place] at [facility]   PATIENT NAME: Shelby Odonnell    MR#:  [id number]  DATE OF BIRTH:  11/27/1970  DATE OF ADMISSION:  07/26/2016  PRIMARY CARE PHYSICIAN: [facility]   REQUESTING/REFERRING PHYSICIAN:   CHIEF COMPLAINT:   Chief Complaint  Patient presents with  . Drug Overdose    HISTORY OF PRESENT ILLNESS: Shelby Odonnell  is a 46 y.o. female with No significant past medical history presented to the emergency room after she overdosed with 3 bottles of venlafaxine pills. Patient said she felt depressed and wanted to kill herself. She lacks energy, and has thoughts of hurting herself. Patient involuntarily committed in the emergency room. Poison control was contacted by the ER physician who recommended cardiac monitoring and EKG every 6 hourly. Patient is awake, alert and oriented to time place and person. No complaints of any chest pain, shortness of breath and palpitations.Patient also had a seizure in emergency room. Hospitalist service was consulted for further care of the patient.  PAST MEDICAL HISTORY:  History reviewed. No pertinent past medical history.  No history of cad, hypertension, diabetes.  PAST SURGICAL HISTORY: Past Surgical History:  Procedure Laterality Date  . CESAREAN SECTION      SOCIAL HISTORY:  Social History  Substance Use Topics  . Smoking status: Current Every Day Smoker    Packs/day: 1.00    Types: Cigarettes  . Smokeless tobacco: Never Used  . Alcohol use Yes    FAMILY HISTORY:  Family History  Problem Relation Age of Onset  . Colon cancer Father   Mother alive Father deceased  DRUG ALLERGIES:  Allergies  Allergen Reactions  . Fluoxetine Other (See Comments)    Other Reaction: OTHER REACTION, onSet: 09/03/2009  . Tetracyclines & Related Rash    REVIEW OF SYSTEMS:   CONSTITUTIONAL: No fever, has weakness.  EYES: No blurred or double vision.  EARS, NOSE, AND THROAT: No tinnitus or ear  pain.  RESPIRATORY: No cough, shortness of breath, wheezing or hemoptysis.  CARDIOVASCULAR: No chest pain, orthopnea, edema.  GASTROINTESTINAL: No nausea, vomiting, diarrhea or abdominal pain.  GENITOURINARY: No dysuria, hematuria.  ENDOCRINE: No polyuria, nocturia,  HEMATOLOGY: No anemia, easy bruising or bleeding SKIN: No rash or lesion. MUSCULOSKELETAL: No joint pain or arthritis.   NEUROLOGIC: No tingling, numbness, weakness.  PSYCHIATRY: No anxiety or depression.   MEDICATIONS AT HOME:  Prior to Admission medications   Medication Sig Start Date End Date Taking? Authorizing Provider  albuterol (PROVENTIL HFA;VENTOLIN HFA) 108 (90 Base) MCG/ACT inhaler Inhale 2 puffs into the lungs daily as needed. 02/19/16 02/18/17 Yes Historical Provider, MD  ascorbic acid (VITAMIN C) 250 MG tablet Take 250 mg by mouth daily.   Yes Historical Provider, MD  norethindrone-ethinyl estradiol (JUNEL FE,GILDESS FE,LOESTRIN FE) 1-20 MG-MCG tablet Take 1 tablet by mouth daily.   Yes Historical Provider, MD  Omega-3 Fatty Acids (FISH OIL PO) Take 1 capsule by mouth daily.   Yes Historical Provider, MD  simvastatin (ZOCOR) 20 MG tablet Take 20 mg by mouth daily. 05/31/16  Yes Historical Provider, MD  venlafaxine (EFFEXOR) 75 MG tablet Take 75 mg by mouth 2 (two) times daily.   Yes Historical Provider, MD      PHYSICAL EXAMINATION:   VITAL SIGNS: Blood pressure (!) 108/49, pulse (!) 107, temperature 98.7 F (37.1 C), temperature source Oral, resp. rate 17, height 5\' 5"  (1.651 m), weight 75.8 kg (167 lb), SpO2 96 %.  GENERAL:  46  y.o.-year-old patient lying in the bed with no acute distress.  EYES: Pupils equal, round, reactive to light and accommodation. No scleral icterus. Extraocular muscles intact.  HEENT: Head atraumatic, normocephalic. Oropharynx and nasopharynx clear.  NECK:  Supple, no jugular venous distention. No thyroid enlargement, no tenderness.  LUNGS: Normal breath sounds bilaterally, no  wheezing, rales,rhonchi or crepitation. No use of accessory muscles of respiration.  CARDIOVASCULAR: S1, S2 normal. No murmurs, rubs, or gallops.  ABDOMEN: Soft, nontender, nondistended. Bowel sounds present. No organomegaly or mass.  EXTREMITIES: No pedal edema, cyanosis, or clubbing.  NEUROLOGIC: Cranial nerves II through XII are intact. Muscle strength 5/5 in all extremities. Sensation intact. Gait not checked.  PSYCHIATRIC: The patient is alert and oriented x 3.  SKIN: No obvious rash, lesion, or ulcer.   LABORATORY PANEL:   CBC  Recent Labs Lab 07/26/16 2143  WBC 11.0  HGB 13.2  HCT 37.0  PLT 302  MCV 92.0  MCH 32.7  MCHC 35.5  RDW 13.7  LYMPHSABS 2.9  MONOABS 1.0*  EOSABS 0.1  BASOSABS 0.1   ------------------------------------------------------------------------------------------------------------------  Chemistries   Recent Labs Lab 07/26/16 2143  NA 137  K 3.9  CL 106  CO2 24  GLUCOSE 88  BUN 10  CREATININE 0.73  CALCIUM 8.5*  AST 26  ALT 24  ALKPHOS 53  BILITOT 0.5   ------------------------------------------------------------------------------------------------------------------ estimated creatinine clearance is 87.6 mL/min (by C-G formula based on SCr of 0.73 mg/dL). ------------------------------------------------------------------------------------------------------------------ No results for input(s): TSH, T4TOTAL, T3FREE, THYROIDAB in the last 72 hours.  Invalid input(s): FREET3   Coagulation profile No results for input(s): INR, PROTIME in the last 168 hours. ------------------------------------------------------------------------------------------------------------------- No results for input(s): DDIMER in the last 72 hours. -------------------------------------------------------------------------------------------------------------------  Cardiac Enzymes No results for input(s): CKMB, TROPONINI, MYOGLOBIN in the last 168  hours.  Invalid input(s): CK ------------------------------------------------------------------------------------------------------------------ Invalid input(s): POCBNP  ---------------------------------------------------------------------------------------------------------------  Urinalysis    Component Value Date/Time   COLORURINE COLORLESS (A) 07/26/2016 2143   APPEARANCEUR CLEAR (A) 07/26/2016 2143   LABSPEC 1.001 (L) 07/26/2016 2143   PHURINE 7.0 07/26/2016 2143   GLUCOSEU NEGATIVE 07/26/2016 2143   HGBUR SMALL (A) 07/26/2016 2143   BILIRUBINUR NEGATIVE 07/26/2016 2143   KETONESUR NEGATIVE 07/26/2016 2143   PROTEINUR NEGATIVE 07/26/2016 2143   NITRITE NEGATIVE 07/26/2016 2143   LEUKOCYTESUR NEGATIVE 07/26/2016 2143     RADIOLOGY: No results found.  EKG: Orders placed or performed during the hospital encounter of 07/26/16  . EKG 12-Lead  . EKG 12-Lead  . ED EKG  . ED EKG  . ED EKG  . ED EKG  . ED EKG  . ED EKG  . ED EKG  . EKG 12-Lead  . EKG 12-Lead  . EKG 12-Lead  . EKG 12-Lead  . EKG 12-Lead  . EKG 12-Lead    IMPRESSION AND PLAN: 46 year old female patient with no significant past medical history presented to the emergency room after she overdosed herself with venlafaxine pills with the intent to kill herself. Admitting diagnosis 1. Drug overdose 2. Suicide attempt 3. Depression 4. Seizure Treatment plan Admit patient to medical floor observation bed 1: 1 observation for patient safety When necessary IV Ativan for seizures Psychiatric consultation Neurology consultation if there is any recurrence of seizure DVT prophylaxis subcutaneous Lovenox 40 MG daily  All the records are reviewed and case discussed with ED provider. Management plans discussed with the patient, family and they are in agreement.  CODE STATUS:FULL CODE Code Status History    This patient does  not have a recorded code status. Please follow your organizational policy for  patients in this situation.       TOTAL TIME TAKING CARE OF THIS PATIENT: 50 minutes.    Ihor Austin M.D on 07/27/2016 at 5:08 AM  Between 7am to 6pm - Pager - [phone]  After 6pm go to [email or web address] - password [email or web address] [facility]  [facility]  Office  [phone]  CC: Primary care physician; [facility]

## 2016-07-27 NOTE — Plan of Care (Signed)
Problem: Safety: Goal: Ability to remain free from injury will improve Outcome: Progressing Pt bit front of her tongue during seizure activity this am at 0831; [facility]

## 2016-07-27 NOTE — ED Notes (Signed)
Pt mental status returned to normal, alerts and oriented x4 at this time.

## 2016-07-27 NOTE — Progress Notes (Signed)
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

## 2016-07-27 NOTE — ED Notes (Signed)
Pt taken handful of 150mg  extended release Effexor on 07/26/16 around 2040, SI attempt. 24hr cardiac monitor and EKG q6hr per poison control recommendation. Pt placed on cardiac monitor at this time.

## 2016-07-27 NOTE — Consult Note (Signed)
[facility] Face-to-Face Psychiatry Consult   Reason for Consult:  Consult for 46-year-old woman who presented to the emergency room after an overdose on venlafaxine Referring Physician:  Kalisetti Patient Identification: Shelby Odonnell MRN:  [id number] Principal Diagnosis: Severe recurrent major depression without psychotic features (HCC) Diagnosis:   Patient Active Problem List   Diagnosis Date Noted  . Overdose [T50.901A] 07/27/2016  . Severe recurrent major depression without psychotic features (HCC) [F33.2] 07/27/2016  . Overdose of antidepressant, intentional self-harm, initial encounter (HCC) [T43.202A] 07/27/2016  . Seizures (HCC) [R56.9] 07/27/2016  . Alcohol abuse [F10.10] 07/27/2016  . Delirium, drug-induced (HCC) [F19.921] 07/27/2016    Total Time spent with patient: 1 hour  Subjective:   Shelby Odonnell is a 46 y.o. female patient admitted with "this is my first time in the state".  HPI:  Patient interviewed. Chart reviewed. Also with the patient's permission spoke to her husband. This is a 46-year-old woman who presented yesterday to the emergency room after an intentional overdose of venlafaxine. During the interview today the patient at first had no memory of this. She actually thought that she was in the state of [place] and had no idea why she was in the hospital although once I reminded her of the overdose it came back to her. She remembered that she had poured out to her full bottles of venlafaxine tablets on her bed and had eaten them by the handful. Her husband discovered her in the act and tried to make her vomit without success and then brought her to the emergency room. She had not taken any other pills. Patient denied that she was acutely intoxicated at the time. She did not recall what the immediate stress was about this. Husband was able to fill me in on that'll little better. Apparently they have had some marital problems that have been going on for a while. They have  their ups and downs but yesterday there was a run in at an argument about it. She got apparently overwhelmed and had the suicide attempt. Patient is not able to recall much in the way of recent symptoms. She knows she slept poorly last night but doesn't remember recent sleep or appetite at home. She does say that she feels like she has a lot of stress in her life but couldn't remember what it was. He was not aware of hallucinations at home but she is reporting visual hallucinations that are happening in the hospital today mostly just way venous in things not any actual formed hallucinations. Patient admits that she drinks regularly. She says that she drinks several nights a week and will often drink between 2 and 8 drinks an evening. She denies however having had any alcohol the day this happened or the night previously. Husband contradicts this saying that she did have alcohol Sunday night. No evidence that she is abusing other drugs. Not currently seeing a psychiatrist just getting her medicine from her primary care doctor. Patient presented to the emergency room with a seizure. She has no history of prior seizures.  Medical history: Takes simvastatin for dyslipidemia. Otherwise evidently doesn't have significant ongoing medical issues.  Social history: Patient is married lives with her husband has 2 adult sons who live at home as well.  Substance abuse history: Patient and husband both admit that she has been drinking more heavily and that it seems to of been a problem. She has never been in any kind of treatment. No known history of seizures or DTs but   she does get tremulous when she stops drinking. Denies any use of other drugs.  Past Psychiatric History: Patient has been treated for depression for years. She has no past suicide attempts but does have a history of cutting herself when she was much younger. She is currently taking venlafaxine 75 mg a day from her primary care doctor. It looks like there  is been some thought about trying to taper off it but she tells me that when she cut down below 75 she started to get withdrawal symptoms. No inpatient hospitalization no history of violent psychosis or mania.  Risk to Self: Suicidal Ideation: Yes-Currently Present Suicidal Intent: Yes-Currently Present Is patient at risk for suicide?: No (pt denies ) Suicidal Plan?: No Access to Means: Yes Specify Access to Suicidal Means: Use of medication she has What has been your use of drugs/alcohol within the last 12 months?: occassional use of alcohol How many times?: 0 Other Self Harm Risks: denied Triggers for Past Attempts: None known Intentional Self Injurious Behavior: None (history of cutting when she was a teenager) Risk to Others: Homicidal Ideation: No Thoughts of Harm to Others: No Current Homicidal Intent: No Current Homicidal Plan: No Access to Homicidal Means: No Identified Victim: None identified History of harm to others?: No Assessment of Violence: None Noted Violent Behavior Description: denied Does patient have access to weapons?: No Criminal Charges Pending?: No Does patient have a court date: No Prior Inpatient Therapy: Prior Inpatient Therapy: No Prior Therapy Dates: n/a Prior Therapy Facilty/Provider(s): n/a Reason for Treatment: n/a Prior Outpatient Therapy: Prior Outpatient Therapy: No Prior Therapy Dates: n/a Prior Therapy Facilty/Provider(s): n/a Reason for Treatment: n/a Does patient have an ACCT team?: No Does patient have Intensive In-House Services?  : No Does patient have [facility] services? : No Does patient have P4CC services?: No  Past Medical History: History reviewed. No pertinent past medical history.  Past Surgical History:  Procedure Laterality Date  . CESAREAN SECTION     Family History:  Family History  Problem Relation Age of Onset  . Colon cancer Father    Family Psychiatric  History: Has an older sister with bipolar disorder no known  family history of suicide Social History:  History  Alcohol Use  . Yes     History  Drug Use No    Social History   Social History  . Marital status: Married    Spouse name: N/A  . Number of children: N/A  . Years of education: N/A   Social History Main Topics  . Smoking status: Current Every Day Smoker    Packs/day: 1.00    Types: Cigarettes  . Smokeless tobacco: Never Used  . Alcohol use Yes  . Drug use: No  . Sexual activity: Not Asked   Other Topics Concern  . None   Social History Narrative  . None   Additional Social History:    Allergies:   Allergies  Allergen Reactions  . Fluoxetine Other (See Comments)    Other Reaction: OTHER REACTION, onSet: 09/03/2009  . Tetracyclines & Related Rash    Labs:  Results for orders placed or performed during the hospital encounter of 07/26/16 (from the past 48 hour(s))  Acetaminophen level     Status: Abnormal   Collection Time: 07/26/16  9:43 PM  Result Value Ref Range   Acetaminophen (Tylenol), Serum <10 (L) 10 - 30 ug/mL    Comment:        THERAPEUTIC CONCENTRATIONS VARY SIGNIFICANTLY. A RANGE OF 10-30   ug/mL MAY BE AN EFFECTIVE CONCENTRATION FOR MANY PATIENTS. HOWEVER, SOME ARE BEST TREATED AT CONCENTRATIONS OUTSIDE THIS RANGE. ACETAMINOPHEN CONCENTRATIONS >150 ug/mL AT 4 HOURS AFTER INGESTION AND >50 ug/mL AT 12 HOURS AFTER INGESTION ARE OFTEN ASSOCIATED WITH TOXIC REACTIONS.   Salicylate level     Status: None   Collection Time: 07/26/16  9:43 PM  Result Value Ref Range   Salicylate Lvl <1.6 2.8 - 30.0 mg/dL  CBC with Differential     Status: Abnormal   Collection Time: 07/26/16  9:43 PM  Result Value Ref Range   WBC 11.0 3.6 - 11.0 K/uL   RBC 4.02 3.80 - 5.20 MIL/uL   Hemoglobin 13.2 12.0 - 16.0 g/dL   HCT 37.0 35.0 - 47.0 %   MCV 92.0 80.0 - 100.0 fL   MCH 32.7 26.0 - 34.0 pg   MCHC 35.5 32.0 - 36.0 g/dL   RDW 13.7 11.5 - 14.5 %   Platelets 302 150 - 440 K/uL   Neutrophils Relative % 63 %    Neutro Abs 7.0 (H) 1.4 - 6.5 K/uL   Lymphocytes Relative 26 %   Lymphs Abs 2.9 1.0 - 3.6 K/uL   Monocytes Relative 9 %   Monocytes Absolute 1.0 (H) 0.2 - 0.9 K/uL   Eosinophils Relative 1 %   Eosinophils Absolute 0.1 0 - 0.7 K/uL   Basophils Relative 1 %   Basophils Absolute 0.1 0 - 0.1 K/uL  Comprehensive metabolic panel     Status: Abnormal   Collection Time: 07/26/16  9:43 PM  Result Value Ref Range   Sodium 137 135 - 145 mmol/L   Potassium 3.9 3.5 - 5.1 mmol/L    Comment: HEMOLYSIS AT THIS LEVEL MAY AFFECT RESULT   Chloride 106 101 - 111 mmol/L   CO2 24 22 - 32 mmol/L   Glucose, Bld 88 65 - 99 mg/dL   BUN 10 6 - 20 mg/dL   Creatinine, Ser 0.73 0.44 - 1.00 mg/dL   Calcium 8.5 (L) 8.9 - 10.3 mg/dL   Total Protein 6.6 6.5 - 8.1 g/dL   Albumin 3.9 3.5 - 5.0 g/dL   AST 26 15 - 41 U/L   ALT 24 14 - 54 U/L   Alkaline Phosphatase 53 38 - 126 U/L   Total Bilirubin 0.5 0.3 - 1.2 mg/dL   GFR calc non Af Amer >60 >60 mL/min   GFR calc Af Amer >60 >60 mL/min    Comment: (NOTE) The eGFR has been calculated using the CKD EPI equation. This calculation has not been validated in all clinical situations. eGFR's persistently <60 mL/min signify possible Chronic Kidney Disease.    Anion gap 7 5 - 15  Urinalysis, Complete w Microscopic     Status: Abnormal   Collection Time: 07/26/16  9:43 PM  Result Value Ref Range   Color, Urine COLORLESS (A) YELLOW   APPearance CLEAR (A) CLEAR   Specific Gravity, Urine 1.001 (L) 1.005 - 1.030   pH 7.0 5.0 - 8.0   Glucose, UA NEGATIVE NEGATIVE mg/dL   Hgb urine dipstick SMALL (A) NEGATIVE   Bilirubin Urine NEGATIVE NEGATIVE   Ketones, ur NEGATIVE NEGATIVE mg/dL   Protein, ur NEGATIVE NEGATIVE mg/dL   Nitrite NEGATIVE NEGATIVE   Leukocytes, UA NEGATIVE NEGATIVE   RBC / HPF 0-5 0 - 5 RBC/hpf   WBC, UA NONE SEEN 0 - 5 WBC/hpf   Bacteria, UA NONE SEEN NONE SEEN   Squamous Epithelial / LPF NONE SEEN NONE SEEN  Urine Drug Screen, Qualitative ([facility]  only)     Status: None   Collection Time: 07/26/16  9:43 PM  Result Value Ref Range   Tricyclic, Ur Screen NONE DETECTED NONE DETECTED   Amphetamines, Ur Screen NONE DETECTED NONE DETECTED   MDMA (Ecstasy)Ur Screen NONE DETECTED NONE DETECTED   Cocaine Metabolite,Ur [place] NONE DETECTED NONE DETECTED   Opiate, Ur Screen NONE DETECTED NONE DETECTED   Phencyclidine (PCP) Ur S NONE DETECTED NONE DETECTED   Cannabinoid 50 Ng, Ur [place] NONE DETECTED NONE DETECTED   Barbiturates, Ur Screen NONE DETECTED NONE DETECTED   Benzodiazepine, Ur Scrn NONE DETECTED NONE DETECTED   Methadone Scn, Ur NONE DETECTED NONE DETECTED    Comment: (NOTE) 144  Tricyclics, urine               Cutoff 1000 ng/mL 200  Amphetamines, urine             Cutoff 1000 ng/mL 300  MDMA (Ecstasy), urine           Cutoff 500 ng/mL 400  Cocaine Metabolite, urine       Cutoff 300 ng/mL 500  Opiate, urine                   Cutoff 300 ng/mL 600  Phencyclidine (PCP), urine      Cutoff 25 ng/mL 700  Cannabinoid, urine              Cutoff 50 ng/mL 800  Barbiturates, urine             Cutoff 200 ng/mL 900  Benzodiazepine, urine           Cutoff 200 ng/mL 1000 Methadone, urine                Cutoff 300 ng/mL 1100 1200 The urine drug screen provides only a preliminary, unconfirmed 1300 analytical test result and should not be used for non-medical 1400 purposes. Clinical consideration and professional judgment should 1500 be applied to any positive drug screen result due to possible 1600 interfering substances. A more specific alternate chemical method 1700 must be used in order to obtain a confirmed analytical result.  1800 Gas chromato graphy / mass spectrometry (GC/MS) is the preferred 1900 confirmatory method.   CBC     Status: Abnormal   Collection Time: 07/27/16  5:50 AM  Result Value Ref Range   WBC 11.2 (H) 3.6 - 11.0 K/uL   RBC 3.67 (L) 3.80 - 5.20 MIL/uL   Hemoglobin 12.1 12.0 - 16.0 g/dL   HCT 34.3 (L) 35.0 - 47.0 %   MCV  93.4 80.0 - 100.0 fL   MCH 33.0 26.0 - 34.0 pg   MCHC 35.3 32.0 - 36.0 g/dL   RDW 13.8 11.5 - 14.5 %   Platelets 271 150 - 440 K/uL  Basic metabolic panel     Status: Abnormal   Collection Time: 07/27/16  5:50 AM  Result Value Ref Range   Sodium 139 135 - 145 mmol/L   Potassium 3.7 3.5 - 5.1 mmol/L   Chloride 110 101 - 111 mmol/L   CO2 23 22 - 32 mmol/L   Glucose, Bld 68 65 - 99 mg/dL   BUN 10 6 - 20 mg/dL   Creatinine, Ser 0.56 0.44 - 1.00 mg/dL   Calcium 8.0 (L) 8.9 - 10.3 mg/dL   GFR calc non Af Amer >60 >60 mL/min   GFR calc Af Amer >60 >60 mL/min    Comment: (  NOTE) The eGFR has been calculated using the CKD EPI equation. This calculation has not been validated in all clinical situations. eGFR's persistently <60 mL/min signify possible Chronic Kidney Disease.    Anion gap 6 5 - 15  TSH     Status: Abnormal   Collection Time: 07/27/16  5:50 AM  Result Value Ref Range   TSH 4.715 (H) 0.350 - 4.500 uIU/mL    Comment: Performed by a 3rd Generation assay with a functional sensitivity of <=0.01 uIU/mL.  Magnesium     Status: None   Collection Time: 07/27/16  2:27 PM  Result Value Ref Range   Magnesium 1.9 1.7 - 2.4 mg/dL  Phosphorus     Status: None   Collection Time: 07/27/16  2:27 PM  Result Value Ref Range   Phosphorus 2.7 2.5 - 4.6 mg/dL    Current Facility-Administered Medications  Medication Dose Route Frequency Provider Last Rate Last Dose  . 0.9 %  sodium chloride infusion   Intravenous Continuous Pavan Pyreddy, MD 125 mL/hr at 07/27/16 1151    . acetaminophen (TYLENOL) tablet 650 mg  650 mg Oral Q6H PRN Pavan Pyreddy, MD       Or  . acetaminophen (TYLENOL) suppository 650 mg  650 mg Rectal Q6H PRN Pavan Pyreddy, MD      . albuterol (PROVENTIL) (2.5 MG/3ML) 0.083% nebulizer solution 3 mL  3 mL Inhalation Daily PRN Pavan Pyreddy, MD      . chlorhexidine (PERIDEX) 0.12 % solution 15 mL  15 mL Mouth Rinse BID Radhika Kalisetti, MD   15 mL at 07/27/16 1134  .  enoxaparin (LOVENOX) injection 40 mg  40 mg Subcutaneous Q24H Pavan Pyreddy, MD      . LORazepam (ATIVAN) injection 2 mg  2 mg Intravenous Q4H PRN Pavan Pyreddy, MD   2 mg at 07/27/16 0841  . MEDLINE mouth rinse  15 mL Mouth Rinse q12n4p Radhika Kalisetti, MD   15 mL at 07/27/16 1200  . omega-3 acid ethyl esters (LOVAZA) capsule 1 g  1 g Oral Daily Pavan Pyreddy, MD   1 g at 07/27/16 1134  . ondansetron (ZOFRAN) tablet 4 mg  4 mg Oral Q6H PRN Pavan Pyreddy, MD       Or  . ondansetron (ZOFRAN) injection 4 mg  4 mg Intravenous Q6H PRN Pavan Pyreddy, MD   4 mg at 07/27/16 1448  . [START ON 07/28/2016] pneumococcal 23 valent vaccine (PNU-IMMUNE) injection 0.5 mL  0.5 mL Intramuscular Tomorrow-1000 Alexis Hugelmeyer, DO      . senna-docusate (Senokot-S) tablet 1 tablet  1 tablet Oral QHS PRN Pavan Pyreddy, MD      . simvastatin (ZOCOR) tablet 20 mg  20 mg Oral Daily Pavan Pyreddy, MD   20 mg at 07/27/16 1135  . vitamin C (ASCORBIC ACID) tablet 250 mg  250 mg Oral Daily Pavan Pyreddy, MD   250 mg at 07/27/16 1134    Musculoskeletal: Strength & Muscle Tone: within normal limits Gait & Station: unsteady Patient leans: N/A  Psychiatric Specialty Exam: Physical Exam  Nursing note and vitals reviewed. Constitutional: She appears well-developed and well-nourished.  HENT:  Head: Normocephalic and atraumatic.  Eyes: Conjunctivae are normal. Pupils are equal, round, and reactive to light.  Neck: Normal range of motion.  Cardiovascular: Normal heart sounds.   Respiratory: Effort normal. No respiratory distress.  GI: Soft.  Musculoskeletal: Normal range of motion.  Neurological: She is alert.  Skin: Skin is warm and dry.  Psychiatric: Her affect   is blunt and inappropriate. Her speech is delayed. She is slowed and withdrawn. Thought content is not paranoid. Cognition and memory are impaired. She expresses impulsivity. She expresses suicidal ideation. She expresses no homicidal ideation. She exhibits  abnormal recent memory.    Review of Systems  Constitutional: Negative.   HENT: Negative.   Eyes: Negative.   Respiratory: Negative.   Cardiovascular: Negative.   Gastrointestinal: Negative.   Musculoskeletal: Negative.   Skin: Negative.   Neurological: Negative.   Psychiatric/Behavioral: Positive for depression, hallucinations, memory loss, substance abuse and suicidal ideas. The patient is nervous/anxious and has insomnia.     Blood pressure (!) 93/43, pulse (!) 103, temperature 98.9 F (37.2 C), temperature source Oral, resp. rate 18, height 5' 5" (1.651 m), weight 75.8 kg (167 lb), last menstrual period 07/20/2016, SpO2 92 %.Body mass index is 27.79 kg/m.  General Appearance: Casual  Eye Contact:  Fair  Speech:  Slow  Volume:  Decreased  Mood:  Dysphoric  Affect:  Blunt  Thought Process:  Disorganized  Orientation:  Negative  Thought Content:  Illogical and Tangential  Suicidal Thoughts:  Yes.  with intent/plan  Homicidal Thoughts:  No  Memory:  Immediate;   Fair Recent;   Poor Remote;   Poor  Judgement:  Impaired  Insight:  Fair  Psychomotor Activity:  Decreased  Concentration:  Concentration: Poor  Recall:  Fair  Fund of Knowledge:  Good  Language:  Good  Akathisia:  No  Handed:  Right  AIMS (if indicated):     Assets:  Desire for Improvement Financial Resources/Insurance Housing Physical Health Social Support  ADL's:  Intact  Cognition:  Impaired,  Mild  Sleep:        Treatment Plan Summary: Daily contact with patient to assess and evaluate symptoms and progress in treatment, Medication management and Plan 46-year-old woman with an intentional overdose of antidepressants and presented to the hospital acutely. Reported to of had a seizure. This would be consistent with a known consequence of venlafaxine overdose although alcohol withdrawal could play a part as well. Currently presenting with delirium and confusion and altered mental status which is also a  known sequela of venlafaxine overdose but also could be from alcohol withdrawal. Patient is not acutely threatening self-harm but remains confused and anxious and depressed. Recommend continuing suicide watch sitter and commitment for now. Venlafaxine has a fairly short half-life. She may start to go into withdrawal within a couple days or less. I won't restart any medicine now but we may want to restart her venlafaxine possibly the day after tomorrow I would think. No other specific medicine for now while she is on the heart monitor. Once she is medically stable at think she can be transferred down to the psychiatric ward. Case reviewed with TTS.  Disposition: Recommend psychiatric Inpatient admission when medically cleared. Supportive therapy provided about ongoing stressors.  John Clapacs, MD 07/27/2016 4:28 PM 

## 2016-07-27 NOTE — ED Notes (Signed)
Pt poison control contacted, spoke with Jacki ConesLaurie, requested vitals, EKG reading, interventions post seizures. Information provided. Poison control recommends to continue with benzos as needed, cardiac monitor, and EKGs. Dr. Tobi BastosPyreddy notified.

## 2016-07-28 ENCOUNTER — Other Ambulatory Visit: Payer: Self-pay

## 2016-07-28 ENCOUNTER — Inpatient Hospital Stay
Admission: EM | Admit: 2016-07-28 | Discharge: 2016-08-03 | DRG: 885 | Disposition: A | Discharge status: Home / Self Care | Payer: 59 | Source: Intra-hospital | Attending: Psychiatry | Admitting: Psychiatry

## 2016-07-28 DIAGNOSIS — F419 Anxiety disorder, unspecified: Secondary | ICD-10-CM | POA: Diagnosis present

## 2016-07-28 DIAGNOSIS — F339 Major depressive disorder, recurrent, unspecified: Secondary | ICD-10-CM | POA: Diagnosis present

## 2016-07-28 DIAGNOSIS — F17200 Nicotine dependence, unspecified, uncomplicated: Secondary | ICD-10-CM

## 2016-07-28 DIAGNOSIS — J189 Pneumonia, unspecified organism: Secondary | ICD-10-CM

## 2016-07-28 DIAGNOSIS — Z79899 Other long term (current) drug therapy: Secondary | ICD-10-CM | POA: Diagnosis not present

## 2016-07-28 DIAGNOSIS — F332 Major depressive disorder, recurrent severe without psychotic features: Principal | ICD-10-CM | POA: Diagnosis present

## 2016-07-28 DIAGNOSIS — Z888 Allergy status to other drugs, medicaments and biological substances status: Secondary | ICD-10-CM | POA: Diagnosis not present

## 2016-07-28 DIAGNOSIS — T43212A Poisoning by selective serotonin and norepinephrine reuptake inhibitors, intentional self-harm, initial encounter: Secondary | ICD-10-CM | POA: Diagnosis not present

## 2016-07-28 DIAGNOSIS — Z800 Family history of malignant neoplasm of digestive organs: Secondary | ICD-10-CM

## 2016-07-28 DIAGNOSIS — F17210 Nicotine dependence, cigarettes, uncomplicated: Secondary | ICD-10-CM | POA: Diagnosis present

## 2016-07-28 DIAGNOSIS — Z915 Personal history of self-harm: Secondary | ICD-10-CM

## 2016-07-28 DIAGNOSIS — F1010 Alcohol abuse, uncomplicated: Secondary | ICD-10-CM | POA: Diagnosis present

## 2016-07-28 DIAGNOSIS — Z818 Family history of other mental and behavioral disorders: Secondary | ICD-10-CM

## 2016-07-28 DIAGNOSIS — G4700 Insomnia, unspecified: Secondary | ICD-10-CM | POA: Diagnosis present

## 2016-07-28 DIAGNOSIS — T43202A Poisoning by unspecified antidepressants, intentional self-harm, initial encounter: Secondary | ICD-10-CM | POA: Diagnosis present

## 2016-07-28 LAB — BASIC METABOLIC PANEL
Anion gap: 7 (ref 5–15)
BUN: 7 mg/dL (ref 6–20)
CHLORIDE: 110 mmol/L (ref 101–111)
CO2: 22 mmol/L (ref 22–32)
Calcium: 8.1 mg/dL — ABNORMAL LOW (ref 8.9–10.3)
Creatinine, Ser: 0.61 mg/dL (ref 0.44–1.00)
GFR calc Af Amer: >60 mL/min (ref >60)
GFR calc non Af Amer: >60 mL/min (ref >60)
GLUCOSE: 92 mg/dL (ref 65–99)
POTASSIUM: 3.7 mmol/L (ref 3.5–5.1)
Sodium: 139 mmol/L (ref 135–145)

## 2016-07-28 MED ORDER — VENLAFAXINE HCL ER 37.5 MG PO CP24
37.5000 mg | ORAL_CAPSULE | Freq: Every day | ORAL | Status: DC
  Administered 2016-07-29: 37.5 mg via ORAL
  Filled 2016-07-28: qty 1

## 2016-07-28 MED ORDER — GUAIFENESIN 100 MG/5ML PO SOLN
10.0000 mL | ORAL | Status: DC | PRN
  Administered 2016-07-28 (×2): 200 mg via ORAL
  Filled 2016-07-28 (×2): qty 10

## 2016-07-28 MED ORDER — GUAIFENESIN 100 MG/5ML PO SOLN: 10.0000 mL | ORAL | 0 refills | Status: DC | PRN

## 2016-07-28 MED ORDER — ALBUTEROL SULFATE HFA 108 (90 BASE) MCG/ACT IN AERS
2.0000 | INHALATION_SPRAY | RESPIRATORY_TRACT | Status: DC | PRN
  Filled 2016-07-28 (×2): qty 6.7

## 2016-07-28 MED ORDER — MAGNESIUM HYDROXIDE 400 MG/5ML PO SUSP: 30.0000 mL | Freq: Every day | ORAL | Status: DC | PRN

## 2016-07-28 MED ORDER — MOMETASONE FURO-FORMOTEROL FUM 100-5 MCG/ACT IN AERO
2.0000 | INHALATION_SPRAY | Freq: Two times a day (BID) | RESPIRATORY_TRACT | Status: DC
  Administered 2016-07-28: 2 via RESPIRATORY_TRACT
  Filled 2016-07-28: qty 8.8

## 2016-07-28 MED ORDER — BENZONATATE 100 MG PO CAPS
200.0000 mg | ORAL_CAPSULE | Freq: Three times a day (TID) | ORAL | Status: DC
  Administered 2016-07-28: 12:00:00 200 mg via ORAL
  Filled 2016-07-28: qty 2

## 2016-07-28 MED ORDER — ACETAMINOPHEN 325 MG PO TABS: 650.0000 mg | ORAL_TABLET | Freq: Four times a day (QID) | ORAL | Status: DC | PRN

## 2016-07-28 MED ORDER — MOMETASONE FURO-FORMOTEROL FUM 100-5 MCG/ACT IN AERO: 2.0000 | INHALATION_SPRAY | Freq: Two times a day (BID) | RESPIRATORY_TRACT | 0 refills | Status: DC

## 2016-07-28 MED ORDER — ALUM & MAG HYDROXIDE-SIMETH 200-200-20 MG/5ML PO SUSP: 30.0000 mL | ORAL | Status: DC | PRN

## 2016-07-28 MED ORDER — BENZONATATE 200 MG PO CAPS: 200.0000 mg | ORAL_CAPSULE | Freq: Three times a day (TID) | ORAL | 0 refills | Status: DC

## 2016-07-28 NOTE — H&P (Signed)
Pt is being discharged to the behavioral health unit. Report called to [address] [facility] [address] LLCGwen RN. IV x1 removed, pt is waiting on her lunch tray, she will be transferred down after she eats.

## 2016-07-28 NOTE — [facility] Suicide Risk Assessment (Signed)
  [facility] LCSW Group Therapy Note  Date/Time: 07/28/16, 1400  Type of Therapy/Topic:  Group Therapy:  Emotion Regulation  Participation Level:  None   Mood: quiet  Description of Group:    The purpose of this group is to assist patients in learning to regulate negative emotions and experience positive emotions. Patients will be guided to discuss ways in which they have been vulnerable to their negative emotions. These vulnerabilities will be juxtaposed with experiences of positive emotions or situations, and patients challenged to use positive emotions to combat negative ones. Special emphasis will be placed on coping with negative emotions in conflict situations, and patients will process healthy conflict resolution skills.  Therapeutic Goals: 1. Patient will identify two positive emotions or experiences to reflect on in order to balance out negative emotions:  2. Patient will label two or more emotions that they find the most difficult to experience:  3. Patient will be able to demonstrate positive conflict resolution skills through discussion or role plays:   Summary of Patient Progress: Pt only just arrived on the unit.  Pt attended group but did not make any contributions.  When CSW asked her a question, she responded with, "I don't know".       Therapeutic Modalities:   Cognitive Behavioral Therapy Feelings Identification Dialectical Behavioral Therapy  Daleen SquibbGreg Jayme Mednick, LCSW

## 2016-07-28 NOTE — Plan of Care (Signed)
Problem: Activity: Goal: Interest or engagement in activities will improve Outcome: Progressing Receptive t information received  Problem: Education: Goal: Knowledge of [place] [facility] Education information/materials will improve Outcome: Progressing Patient  Receptive to information  received

## 2016-07-28 NOTE — BH Assessment (Signed)
[facility] Dr. Toni Amendlapacs.  Attending Physician will be Dr. Ardyth HarpsHernandez.   [facility] [place].   Intake Paper Work has been signed and placed on patient chart.  Staff is aware of the admission Lindie Spruce(Meghan, Patient's Nurse & Byrd HesselbachMaria, Patient Access).

## 2016-07-28 NOTE — Progress Notes (Signed)
Admission Note:  D: Pt appeared depressed  With  a flat affect.  Pt  denies SI  46 year  Old white female in under th  Services  Of  Dr. Ardyth HarpsHernandez . Patient overdosed on Effexor  In front of her husband  Husband brought patient to hospital. Patient has had 2 seizures  Since being in the hospital .  Patient had been drinking. Voice of drinking 2-4 drinks at a time   Prior to taking to coming  To hospital.  A: Pt admitted to unit per protocol, skin assessment  No breaks  But has tattoes  On both arms and back . Search done and no contraband found.  Pt  educated on therapeutic milieu rules. Pt was introduced to milieu by nursing staff.   R: Pt was receptive to education about the milieu .  15 min safety checks started. Clinical research associatewriter offered support   verdose paotient was drinking  , Patient reported visual hallucinations . Patient also reports  Marital discord.

## 2016-07-28 NOTE — Plan of Care (Signed)
Problem: Activity: Goal: Interest or engagement in activities will improve Outcome: Progressing Pt agrees to work on improving her engagement in activities.

## 2016-07-28 NOTE — Tx Team (Signed)
Initial Treatment Plan 07/28/2016 3:13 PM Shelby DossJudy G Veneziano WUJ:[id number]    PATIENT STRESSORS: Financial difficulties Medication change or noncompliance Substance abuse   PATIENT STRENGTHS: Ability for insight Active sense of humor Capable of independent living Supportive family/friends   PATIENT IDENTIFIED PROBLEMS: Suicidal 07/28/16  Depression 07/28/16                   DISCHARGE CRITERIA:  Improved stabilization in mood, thinking, and/or behavior Medical problems require only outpatient monitoring Safe-care adequate arrangements made  PRELIMINARY DISCHARGE PLAN: Outpatient therapy Return to previous living arrangement  PATIENT/FAMILY INVOLVEMENT: This treatment plan has been presented to and reviewed with the patient, Shelby Odonnell, and/or family member,  .  The patient and family have been given the opportunity to ask questions and make suggestions.  Crist InfanteGwen A Nohelia Valenza, RN 07/28/2016, 3:13 PM

## 2016-07-29 ENCOUNTER — Inpatient Hospital Stay: Payer: 59

## 2016-07-29 ENCOUNTER — Encounter: Payer: Self-pay | Admitting: Psychiatry

## 2016-07-29 DIAGNOSIS — F17200 Nicotine dependence, unspecified, uncomplicated: Secondary | ICD-10-CM

## 2016-07-29 DIAGNOSIS — F332 Major depressive disorder, recurrent severe without psychotic features: Principal | ICD-10-CM

## 2016-07-29 DIAGNOSIS — F1010 Alcohol abuse, uncomplicated: Secondary | ICD-10-CM

## 2016-07-29 LAB — TSH: TSH: 7.053 u[IU]/mL — AB (ref 0.350–4.500)

## 2016-07-29 LAB — LIPID PANEL
CHOL/HDL RATIO: 2.9 ratio
Cholesterol: 132 mg/dL (ref 0–200)
HDL: 45 mg/dL (ref >40)
LDL Cholesterol: 75 mg/dL (ref 0–99)
Triglycerides: 60 mg/dL (ref <150)
VLDL: 12 mg/dL (ref 0–40)

## 2016-07-29 LAB — VITAMIN B12: VITAMIN B 12: 226 pg/mL (ref 180–914)

## 2016-07-29 LAB — RAPID HIV SCREEN (HIV 1/2 AB+AG)
HIV 1/2 Antibodies: NONREACTIVE
HIV-1 P24 Antigen - HIV24: NONREACTIVE

## 2016-07-29 MED ORDER — MAGIC MOUTHWASH
5.0000 mL | Freq: Three times a day (TID) | ORAL | Status: DC
  Administered 2016-07-29 – 2016-07-30 (×4): 5 mL via ORAL
  Filled 2016-07-29 (×7): qty 5

## 2016-07-29 MED ORDER — IBUPROFEN 600 MG PO TABS
600.0000 mg | ORAL_TABLET | Freq: Four times a day (QID) | ORAL | Status: AC
  Administered 2016-07-29 (×3): 600 mg via ORAL
  Filled 2016-07-29 (×3): qty 1

## 2016-07-29 MED ORDER — ADULT MULTIVITAMIN W/MINERALS CH
1.0000 | ORAL_TABLET | Freq: Every day | ORAL | Status: DC
  Administered 2016-07-29 – 2016-08-03 (×6): 1 via ORAL
  Filled 2016-07-29 (×6): qty 1

## 2016-07-29 MED ORDER — LORAZEPAM 0.5 MG PO TABS
0.2500 mg | ORAL_TABLET | Freq: Three times a day (TID) | ORAL | Status: DC
  Administered 2016-07-29 – 2016-08-03 (×13): 0.25 mg via ORAL
  Filled 2016-07-29 (×13): qty 1

## 2016-07-29 MED ORDER — ENSURE ENLIVE PO LIQD
237.0000 mL | Freq: Three times a day (TID) | ORAL | Status: DC
  Administered 2016-07-29 – 2016-08-02 (×3): 237 mL via ORAL

## 2016-07-29 MED ORDER — NICOTINE 21 MG/24HR TD PT24
21.0000 mg | MEDICATED_PATCH | Freq: Every day | TRANSDERMAL | Status: DC
  Administered 2016-07-29: 21 mg via TRANSDERMAL
  Filled 2016-07-29 (×5): qty 1

## 2016-07-29 MED ORDER — ACETAMINOPHEN 500 MG PO TABS
1000.0000 mg | ORAL_TABLET | Freq: Four times a day (QID) | ORAL | Status: DC | PRN
  Administered 2016-08-02: 1000 mg via ORAL
  Filled 2016-07-29: qty 2

## 2016-07-29 MED ORDER — IBUPROFEN 600 MG PO TABS: 600.0000 mg | ORAL_TABLET | Freq: Four times a day (QID) | ORAL | Status: DC | PRN

## 2016-07-29 MED ORDER — BENZONATATE 100 MG PO CAPS: 200.0000 mg | ORAL_CAPSULE | Freq: Three times a day (TID) | ORAL | Status: DC | PRN

## 2016-07-29 NOTE — [facility] Suicide Risk Assessment (Signed)
[facility]   Nursing information obtained from:    Demographic factors:    Current Mental Status:    Loss Factors:    Historical Factors:    Risk Reduction Factors:     Total Time spent with patient: 1 hour Principal Problem: Severe recurrent major depression without psychotic features [facility](HCC) Diagnosis:   Patient Active Problem List   Diagnosis Date Noted  . Tobacco use disorder [F17.200] 07/29/2016  . Alcohol use disorder, mild, abuse [F10.10] 07/29/2016  . Severe recurrent major depression without psychotic features (HCC) [F33.2] 07/27/2016  . Overdose of antidepressant, intentional self-harm, initial encounter [facility](HCC) [T43.202A] 07/27/2016   Subjective Data:   Continued Clinical Symptoms:  Alcohol Use Disorder Identification Test Final Score (AUDIT): 5 The "Alcohol Use Disorders Identification Test", Guidelines for Use in Primary Care, Second Edition.  World Science writerHealth Organization [facility](WHO). Score between 0-7:  no or low risk or alcohol related problems. Score between 8-15:  moderate risk of alcohol related problems. Score between 16-19:  high risk of alcohol related problems. Score 20 or above:  warrants further diagnostic evaluation for alcohol dependence and treatment.   CLINICAL FACTORS:   Depression:   Impulsivity Insomnia Severe Alcohol/Substance Abuse/Dependencies    Psychiatric Specialty Exam: Physical Exam  ROS                                                      Sleep:  Number of Hours: 5      COGNITIVE FEATURES THAT CONTRIBUTE TO RISK:  Polarized thinking    SUICIDE RISK:   Severe:  Frequent, intense, and enduring suicidal ideation, specific plan, no subjective intent, but some objective markers of intent (i.e., choice of lethal method), the method is accessible, some limited preparatory behavior, evidence of impaired self-control, severe dysphoria/symptomatology, multiple risk factors present, and few if any  protective factors, particularly a lack of social support.  [facility]  I certify that inpatient services furnished can reasonably be expected to improve the patient's condition.   Jimmy FootmanHernandez-Gonzalez,  Raychell Holcomb, MD 07/29/2016, 11:46 AM

## 2016-07-29 NOTE — [facility] Group Notes (Signed)
[facility] LCSW Group Therapy Note  Date/Time: 07/29/16, 0930  Type of Therapy/Topic:  Group Therapy:  Balance in Life  Participation Level:    Description of Group:    This group will address the concept of balance and how it feels and looks when one is unbalanced. Patients will be encouraged to process areas in their lives that are out of balance, and identify reasons for remaining unbalanced. Facilitators will guide patients utilizing problem- solving interventions to address and correct the stressor making their life unbalanced. Understanding and applying boundaries will be explored and addressed for obtaining  and maintaining a balanced life. Patients will be encouraged to explore ways to assertively make their unbalanced needs known to significant others in their lives, using other group members and facilitator for support and feedback.  Therapeutic Goals: 1. Patient will identify two or more emotions or situations they have that consume much of in their lives. 2. Patient will identify signs/triggers that life has become out of balance:  3. Patient will identify two ways to set boundaries in order to achieve balance in their lives:  4. Patient will demonstrate ability to communicate their needs through discussion and/or role plays  Summary of Patient Progress: Pt was quiet today as CSW presented information regarding different areas of life that can get out of balance.  Pt was called to meet with the MD soon after and did not have a chance to made individual contributions to the group.          Therapeutic Modalities:   Cognitive Behavioral Therapy Solution-Focused Therapy Assertiveness Training  Daleen SquibbGreg Cathryn Gallery, [place]

## 2016-07-29 NOTE — Progress Notes (Signed)
D: Patient stated slept fair last night .Stated appetite is fair and energy level  low. Stated concentration is good . Stated on Depression scale 6 , hopeless 0 and anxiety 7 .( low 0-10 high) Denies suicidal  homicidal ideations  .  No auditory hallucinations  No pain concerns . Appropriate ADL'S. Interacting with peers and staff. Limited interaction with peers . Attending unit programing  Continue to voice of all over muscle soreness A: Encourage patient participation with unit programming . Instruction  Given on  Medication , verbalize understanding. R: Voice no other concerns. Staff continue to monitor

## 2016-07-29 NOTE — Progress Notes (Signed)
Pt spent the evening hours in her room, but was in a euthymic mood and congruent affect. During a 1:1 conversation with the Clinical research associatewriter, pt denies any SI/HI/VAH and voices no concerns. Said she's had a long day. No sign of seizures noted, will monitor closely for safety.

## 2016-07-29 NOTE — [facility] Group Notes (Signed)
Goals Group Date/Time: 07/29/2016 9:00 AM Type of Therapy and Topic: Group Therapy: Goals Group: SMART Goals   Participation Level: Minimal  Description of Group:    The purpose of a daily goals group is to assist and guide patients in setting recovery/wellness-related goals. The objective is to set goals as they relate to the crisis in which they were admitted. Patients will be using SMART goal modalities to set measurable goals. Characteristics of realistic goals will be discussed and patients will be assisted in setting and processing how one will reach their goal. Facilitator will also assist patients in applying interventions and coping skills learned in psycho-education groups to the SMART goal and process how one will achieve defined goal.   Therapeutic Goals:   -Patients will develop and document one goal related to or their crisis in which brought them into treatment.  -Patients will be guided by LCSW using SMART goal setting modality in how to set a measurable, attainable, realistic and time sensitive goal.  -Patients will process barriers in reaching goal.  -Patients will process interventions in how to overcome and successful in reaching goal.   Patient's Goal: Pt was a little more interactive prior to group today.  Goal for the day is to take a shower.  CSW processed with pt about being active today and she considered other ways to do this, such as attending groups.   Therapeutic Modalities:  Motivational Interviewing  Research officer, political partyCognitive Behavioral Therapy  Crisis Intervention Model  SMART goals setting   Daleen SquibbGreg Breahna Boylen, [place]CSW

## 2016-07-29 NOTE — [facility] Counselor (Signed)
Adult Comprehensive Assessment  Patient ID: Shelby DossJudy G Velis, female   DOB: 05/27/1970, 46 y.o.   MRN: [id number]  Information Source: Information source: Patient  Current Stressors:  Educational / Learning stressors: n/a Employment / Job issues: n/a Family Relationships: n/a Surveyor, quantityinancial / Lack of resources (include bankruptcy): n/a Housing / Lack of housing: n/a Physical health (include injuries & life threatening diseases): n/a Social relationships: n/a Substance abuse: Patient reports occasional alcohol use Bereavement / Loss: n/a  Living/Environment/Situation:  Living Arrangements: Spouse/significant other Living conditions (as described by patient or guardian): "Fine How long has patient lived in current situation?: "At least 15 years.   Family History:  Marital status: Married Number of Years Married: 15 What types of issues is patient dealing with in the relationship?: Patient reports her husband requested an "open marriage", to see other people and patient states she does not like it. Husband was involved with another woman in [place]. [place] and recently moved back to [place] in February.  Additional relationship information: Patient reports her husband has been unfaithful and talking to other women.  Are you sexually active?: Yes What is your sexual orientation?: heterosexual Has your sexual activity been affected by drugs, alcohol, medication, or emotional stress?: n/a Does patient have children?: Yes How many children?: 2 How is patient's relationship with their children?: Patient states she has two sons that live in the home with her.   Childhood History:  By whom was/is the patient raised?: Both parents Additional childhood history information: n/a Description of patient's relationship with caregiver when they were a child: Patient states she had a good childhood. Patient's description of current relationship with people who raised him/her: Patient  states she does not speak with her mother and her father is deceased. How were you disciplined when you got in trouble as a child/adolescent?: n/a Does patient have siblings?: Yes Number of Siblings: 2 Description of patient's current relationship with siblings: 2 sisters. Patient states she has good relationship with one sister.  Did patient suffer any verbal/emotional/physical/sexual abuse as a child?: No Did patient suffer from severe childhood neglect?: No Has patient ever been sexually abused/assaulted/raped as an adolescent or adult?: No Was the patient ever a victim of a crime or a disaster?: No Witnessed domestic violence?: No Has patient been effected by domestic violence as an adult?: No  Education:  Highest grade of school patient has completed: 12th Currently a student?: No Name of [employer]: [employer]  Learning disability?: No  Employment/Work Situation:   Employment situation: Employed Where is patient currently employed?: [employer] How long has patient been employed?: About 6 years Patient's job has been impacted by current illness: No What is the longest time patient has a held a job?: About 8 years 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  Are These Weapons Safely Secured?:  (CSW will verify with husband that guns are safe. )  Financial Resources:   Financial resources: Income from employment, Income from spouse, Private insurance Does patient have a representative payee or guardian?: No  Alcohol/Substance Abuse:   What has been your use of drugs/alcohol within the last 12 months?: Occasional use of alcohol If attempted suicide,  did  drugs/alcohol play a role in this?: Yes (Patient overdosed on Effexor) Alcohol/Substance Abuse Treatment Hx: Denies past history Has alcohol/substance abuse ever caused legal problems?: No  Social Support System:   Patient's Community Support System: Fair Museum/gallery exhibitions officer System: Patient has support from her husband and children Type of faith/religion: n/a How does patient's faith help to cope with current illness?: n/a  Leisure/Recreation:   Leisure and Hobbies: reading  Strengths/Needs:   What things does the patient do well?: " I don't know" In what areas does patient struggle / problems for patient: depression  Discharge Plan:   Does patient have access to transportation?: Yes Will patient be returning to same living situation after discharge?: Yes Currently receiving community mental health services: No If no, would patient like referral for services when discharged?: Yes (What county?) [facility] Does patient have financial barriers related to discharge medications?: No  Summary/Recommendations:   Patient is a  year old 3 admitted involuntarily with a diagnosis of severe recurrent major depression without psychotic features. Information was obtained from psychosocial assessment completed with patient and chart review conducted by this evaluator. Patient presented to the hospital after overdose on Effexor. Patient reports primary triggers for admission were marital issues with her husband and worsening depression. Patient will benefit from crisis stabilization, medication evaluation, group therapy and psycho education in addition to case management for discharge. At discharge, it is recommended that patient remain compliant with established discharge plan and continued treatment.   Abdirahim Flavell G. Garnette Czech MSW, [facility] 07/29/2016 10:25 AM

## 2016-07-29 NOTE — H&P (Addendum)
Psychiatric Admission Assessment Adult  Patient Identification: Shelby Odonnell MRN:  [id number] Date of Evaluation:  07/29/2016 Chief Complaint:  Depression Principal Diagnosis: Severe recurrent major depression without psychotic features [facility]) Diagnosis:   Patient Active Problem List   Diagnosis Date Noted  . Tobacco use disorder [F17.200] 07/29/2016  . Alcohol use disorder, mild, abuse [F10.10] 07/29/2016  . Severe recurrent major depression without psychotic features (HCC) [F33.2] 07/27/2016  . Overdose of antidepressant, intentional self-harm, initial encounter [facility]) [T43.202A] 07/27/2016   History of Present Illness:  Patient is a 46 year old married Caucasian female from [place]. Patient presented to our emergency department via EMS on March 19 after a suicidal attempt by overdose on Effexor.  Per EMS, pt was locked in a room for about half an hour and they had to break the door down to get to her. She took several handfuls of 150 mg extended release Effexor.  Patient was transferred to the medical floor. She had a total of 3 seizures.  Patient was interviewed today. She reports that about 4 years ago she was started on Effexor by her primary care provider for depression. She says that the Effexor has been very helpful for her. She started having some marital issues about a year ago and since then her mood started to decline.  Patient explains she found out her husband was meeting women online. One of his relationships became very serious and he asked his wife if she was willing to have an open marriage. The woman he was dating was originally from [place] and she ended up moving 6 miles away from their home.  The woman evidentially left [place] and went back to [place] in February. She told that the relationship between her husband and this woman was going to fade away however he was trying to make arrangements to go and visit her in [place].  On the day of the overdose  patient states that nothing special or stressful happened. She feels the overdose was a very impulsive act. She denied having suicidal thoughts prior to that they. Patient denies any current suicidal ideation. She is glad to be alive. She realizes how damaging this could have been of her children. She denies any auditory or visual hallucinations and denies having any homicidal ideation. For several weeks prior to admission she was displaying of depressed mood, decreased sleep and poor energy poor appetite and poor concentration. She said that she lost about 15 pounds in the last 8 months.  Patient denied any other stressors other than the marital issues. She is currently employed at [employer] or in a full-time position. Her husband is working as a Naval architect.Patient denies any major financial stressors at this time.  Patient denies any use of illicit substances. She drinks about 4 shots of liquor 2 times a week. She denies the use of any prescription medications. She smokes about one pack of cigarettes per day.  Trauma history she denies suffering from any traumatic events  Associated Signs/Symptoms: Depression Symptoms:  depressed mood, insomnia, psychomotor retardation, fatigue, difficulty concentrating, suicidal attempt, (Hypo) Manic Symptoms:  denies Anxiety Symptoms:  Excessive Worry, Psychotic Symptoms:  denies PTSD Symptoms: Negative Total Time spent with patient: 1 hour  Past Psychiatric History: Patient denies prior psychiatric hospitalizations, or suicidal attempts. Patient does report history of cutting as a teenager but has not cut since then. Patient was diagnosed with depression and prescribed with Effexor by her primary care provider 4 years ago. She has never seen a  psychiatrist or a therapist.  Is the patient at risk to self? Yes.    Has the patient been a risk to self in the past 6 months? No.  Has the patient been a risk to self within the distant past? No.  Is the patient a  risk to others? No.  Has the patient been a risk to others in the past 6 months? No.  Has the patient been a risk to others within the distant past? No.   Alcohol Screening: 1. How often do you have a drink containing alcohol?: 2 to 3 times a week 2. How many drinks containing alcohol do you have on a typical day when you are drinking?: 3 or 4 3. How often do you have six or more drinks on one occasion?: Less than monthly Preliminary Score: 2 4. How often during the last year have you found that you were not able to stop drinking once you had started?: Never 5. How often during the last year have you failed to do what was normally expected from you becasue of drinking?: Never 6. How often during the last year have you needed a first drink in the morning to get yourself going after a heavy drinking session?: Never 7. How often during the last year have you had a feeling of guilt of remorse after drinking?: Never 8. How often during the last year have you been unable to remember what happened the night before because you had been drinking?: Never 9. Have you or someone else been injured as a result of your drinking?: No 10. Has a relative or friend or a doctor or another health worker been concerned about your drinking or suggested you cut down?: No Alcohol Use Disorder Identification Test Final Score (AUDIT): 5 Brief Intervention: AUDIT score less than 7 or less-screening does not suggest unhealthy drinking-brief intervention not indicated  Past Medical History: History reviewed. No pertinent past medical history.  Past Surgical History:  Procedure Laterality Date  . CESAREAN SECTION     Family History:  Family History  Problem Relation Age of Onset  . Colon cancer Father    Family Psychiatric  History: Reports her sister suffers from bipolar disorder. There is no history of suicidal attempts or substance abuse in her family  Tobacco Screening: Have you used any form of tobacco in the  last 30 days? (Cigarettes, Smokeless Tobacco, Cigars, and/or Pipes): Yes Tobacco use, Select all that apply: 5 or more cigarettes per day Are you interested in Tobacco Cessation Medications?: No, patient refused Counseled patient on smoking cessation including recognizing danger situations, developing coping skills and basic information about quitting provided: Refused/Declined practical counseling  Social History: Patient is originally from [place]. She has a high school education. She works at [employer] has been there for several years. She does secretarial work for them.  Patient has been married twice. She has been with her husband for 15 years. She has 2 adult children from a different relationship they are 47 and 48 years old. They're currently living with her.  Patient denies any legal problems. Denies any military history.  Patient is close with her younger sister, they both work at the same place. She is does not have a close relationship with her older sister or her mother. Her father passed away about 15 years ago due to colon cancer. History  Alcohol Use  . Yes     History  Drug Use No    Additional Social History: Marital  status: Married Number of Years Married: 15 What types of issues is patient dealing with in the relationship?: Patient reports her husband requested an "open marriage", to see other people and patient states she does not like it. Husband was involved with another woman in [place]. [place] and recently moved back to [place] in February.  Additional relationship information: Patient reports her husband has been unfaithful and talking to other women.  Are you sexually active?: Yes What is your sexual orientation?: heterosexual Has your sexual activity been affected by drugs, alcohol, medication, or emotional stress?: n/a Does patient have children?: Yes How many children?: 2 How is patient's relationship with their children?: Patient  states she has two sons that live in the home with her.       Allergies:   Allergies  Allergen Reactions  . Fluoxetine Other (See Comments)    Other Reaction: OTHER REACTION, onSet: 09/03/2009  . Tetracyclines & Related Rash   Lab Results:  Results for orders placed or performed during the hospital encounter of 07/28/16 (from the past 48 hour(s))  Lipid panel     Status: None   Collection Time: 07/29/16  6:45 AM  Result Value Ref Range   Cholesterol 132 0 - 200 mg/dL   Triglycerides 60 <657<150 mg/dL   HDL 45 >84>40 mg/dL   Total CHOL/HDL Ratio 2.9 RATIO   VLDL 12 0 - 40 mg/dL   LDL Cholesterol 75 0 - 99 mg/dL    Comment:        Total Cholesterol/HDL:CHD Risk Coronary Heart Disease Risk Table                     Men   Women  1/2 Average Risk   3.4   3.3  Average Risk       5.0   4.4  2 X Average Risk   9.6   7.1  3 X Average Risk  23.4   11.0        Use the calculated Patient Ratio above and the CHD Risk Table to determine the patient's CHD Risk.        ATP III CLASSIFICATION (LDL):  <100     mg/dL   Optimal  [id number]  mg/dL   Near or Above                    Optimal  130-159  mg/dL   Borderline  [id number]  mg/dL   High  >440>190     mg/dL   Very High   TSH     Status: Abnormal   Collection Time: 07/29/16  6:45 AM  Result Value Ref Range   TSH 7.053 (H) 0.350 - 4.500 uIU/mL    Comment: Performed by a 3rd Generation assay with a functional sensitivity of <=0.01 uIU/mL.    Blood Alcohol level:  No results found for: [facility]  Metabolic Disorder Labs:  No results found for: HGBA1C, MPG No results found for: PROLACTIN Lab Results  Component Value Date   CHOL 132 07/29/2016   TRIG 60 07/29/2016   HDL 45 07/29/2016   CHOLHDL 2.9 07/29/2016   VLDL 12 07/29/2016   LDLCALC 75 07/29/2016    Current Medications: Current Facility-Administered Medications  Medication Dose Route Frequency Provider Last Rate Last Dose  . acetaminophen (TYLENOL) tablet 1,000 mg  1,000 mg Oral Q6H PRN  Jimmy FootmanAndrea Hernandez-Gonzalez, MD      . albuterol (PROVENTIL HFA;VENTOLIN HFA) 108 (90 Base)  MCG/ACT inhaler 2 puff  2 puff Inhalation Q4H PRN Audery Amel, MD      . alum & mag hydroxide-simeth (MAALOX/MYLANTA) 200-200-20 MG/5ML suspension 30 mL  30 mL Oral Q4H PRN Audery Amel, MD      . benzonatate (TESSALON) capsule 200 mg  200 mg Oral TID PRN Jimmy Footman, MD      . feeding supplement (ENSURE ENLIVE) (ENSURE ENLIVE) liquid 237 mL  237 mL Oral TID BM Jimmy Footman, MD      . ibuprofen (ADVIL,MOTRIN) tablet 600 mg  600 mg Oral QID Jimmy Footman, MD      . Melene Muller ON 07/30/2016] ibuprofen (ADVIL,MOTRIN) tablet 600 mg  600 mg Oral Q6H PRN Jimmy Footman, MD      . LORazepam (ATIVAN) tablet 0.25 mg  0.25 mg Oral TID Jimmy Footman, MD      . magic mouthwash  5 mL Oral TID AC & HS Jimmy Footman, MD      . magnesium hydroxide (MILK OF MAGNESIA) suspension 30 mL  30 mL Oral Daily PRN Audery Amel, MD      . multivitamin with minerals tablet 1 tablet  1 tablet Oral Daily Jimmy Footman, MD      . nicotine (NICODERM CQ - dosed in mg/24 hours) patch 21 mg  21 mg Transdermal Daily Jimmy Footman, MD       PTA Medications: Prescriptions Prior to Admission  Medication Sig Dispense Refill Last Dose  . albuterol (PROVENTIL HFA;VENTOLIN HFA) 108 (90 Base) MCG/ACT inhaler Inhale 2 puffs into the lungs daily as needed.     Marland Kitchen ascorbic acid (VITAMIN C) 250 MG tablet Take 250 mg by mouth daily.     . benzonatate (TESSALON) 200 MG capsule Take 1 capsule (200 mg total) by mouth 3 (three) times daily. 20 capsule 0   . guaiFENesin (ROBITUSSIN) 100 MG/5ML SOLN Take 10 mLs (200 mg total) by mouth every 4 (four) hours as needed for cough or to loosen phlegm. 1200 mL 0   . mometasone-formoterol (DULERA) 100-5 MCG/ACT AERO Inhale 2 puffs into the lungs 2 (two) times daily. 1 Inhaler 0   . Omega-3 Fatty Acids (FISH OIL PO) Take 1  capsule by mouth daily.     . simvastatin (ZOCOR) 20 MG tablet Take 20 mg by mouth daily.       Musculoskeletal: Strength & Muscle Tone: within normal limits Gait & Station: normal Patient leans: N/A  Psychiatric Specialty Exam: Physical Exam  Constitutional: She is oriented to person, place, and time. She appears well-developed and well-nourished.  HENT:  Head: Normocephalic and atraumatic.  Eyes: Conjunctivae and EOM are normal.  Neck: Normal range of motion.  Respiratory: Effort normal.  Musculoskeletal: Normal range of motion.  Neurological: She is alert and oriented to person, place, and time.    Review of Systems  Constitutional: Positive for malaise/fatigue.  HENT: Negative.   Eyes: Negative.   Respiratory: Positive for cough and shortness of breath.   Gastrointestinal: Negative.   Genitourinary: Negative.   Musculoskeletal: Negative.   Skin: Negative.   Neurological: Positive for seizures and weakness. Negative for dizziness, tingling, tremors, sensory change, speech change, focal weakness, loss of consciousness and headaches.  Endo/Heme/Allergies: Negative.   Psychiatric/Behavioral: Positive for depression and substance abuse. Negative for hallucinations, memory loss and suicidal ideas. The patient is not nervous/anxious and does not have insomnia.     Blood pressure 117/60, pulse 94, temperature 99.4 F (37.4 C), resp. rate 18, height 5'  5" (1.651 m), weight 81.2 kg (179 lb), last menstrual period 07/20/2016, SpO2 93 %.Body mass index is 29.79 kg/m.  General Appearance: Disheveled  Eye Contact:  Minimal  Speech:  Slow  Volume:  Decreased  Mood:  Dysphoric  Affect:  Blunt  Thought Process:  Linear and Descriptions of Associations: Intact  Orientation:  Full (Time, Place, and Person)  Thought Content:  Hallucinations: None  Suicidal Thoughts:  No  Homicidal Thoughts:  No  Memory:  Immediate;   Fair Recent;   Poor Remote;   Good  Judgement:  Fair  Insight:   Fair  Psychomotor Activity:  Decreased  Concentration:  Concentration: Fair and Attention Span: Fair  Recall:  Fair  Fund of Knowledge:  Good  Language:  Good  Akathisia:  No  Handed:    AIMS (if indicated):     Assets:  Architect Physical Health Social Support  ADL's:  Intact  Cognition:  WNL  Sleep:  Number of Hours: 5    Treatment Plan Summary:  Patient is a 46 year old married Caucasian female with major depressive disorder. The patient was admitted to our unit after a serious overdose on Effexor.  Currently the patient states she is glad to be no life. She denies having suicidal ideation at this time.  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  MDD: Patient has serious overdose on Effexor on March 19. At this time I will prefer not to start her on any antidepressants.    Anxiety: Will start the patient on Ativan 0.25 mg 3 times day  Insomnia: Will have an order for Vistaril when necessary  Poor appetite: Will order ensure am multivitamins with minerals  Tobacco use disorder we'll order nicotine patch 21 mg a day  Rule out aspiration pneumonia: Patient was displaying shortness of breath and has been coughing. I will order the checks x-ray.  Cough: I will order Tessalon Perles when necessary  Mouth ulcer: bit toungue  during seizure.  Will order Magic mouthwash 4 times a day  Vital signs daily  Diet regular  Precautions every 15 minute checks  Hospitalization status involuntary commitment  Labs I will order thyroid panel, B12 and HIV.  Records from care everywhere were reviewed. There is no evidence of prior psychiatric hospitalizations. Patient sees primary care at [facility] in [place].  Physician Treatment Plan for Primary Diagnosis: Severe recurrent major depression without psychotic features (HCC) Long Term Goal(s): Improvement in symptoms so as ready for discharge  Short Term Goals:  Ability to verbalize feelings will improve, Ability to demonstrate self-control will improve and Ability to identify and develop effective coping behaviors will improve  Physician Treatment Plan for Secondary Diagnosis: Principal Problem:   Severe recurrent major depression without psychotic features (HCC) Active Problems:   Overdose of antidepressant, intentional self-harm, initial encounter (HCC)   Tobacco use disorder   Alcohol use disorder, mild, abuse  Long Term Goal(s): Improvement in symptoms so as ready for discharge  Short Term Goals: Ability to identify changes in lifestyle to reduce recurrence of condition will improve and Ability to disclose and discuss suicidal ideas  I certify that inpatient services furnished can reasonably be expected to improve the patient's condition.    Jimmy Footman, MD 3/22/201811:47 AM

## 2016-07-29 NOTE — Plan of Care (Signed)
Problem: Coping: Goal: Ability to cope will improve Outcome: Progressing Working on coping skills , handout given   

## 2016-07-29 NOTE — Progress Notes (Signed)
Recreation Therapy Notes  Date: 03.22.18 Time: 1:00 pm Location: Craft Room  Group Topic: Leisure Education  Goal Area(s) Addresses:  Patient will identify activity for each letter of the alphabet. Patient will verbalize ability to integrate positive leisure into life post d/c. Patient will verbalize ability to use leisure as a Associate Professorcoping skill.  Behavioral Response: Did not attend  Intervention: Leisure Alphabet  Activity: Patients were given a Leisure Information systems managerAlphabet worksheet and were instructed to write healthy leisure activities for each letter of the alphabet.  Education: LRT educated patients on what they need to participate in leisure.  Education Outcome: Patient did not attend group.  Clinical Observations/Feedback: Patient did not attend group.  Jacquelynn CreeGreene,Wilman Tucker M, LRT/CTRS 07/29/2016 1:40 PM

## 2016-07-30 LAB — HEMOGLOBIN A1C
HEMOGLOBIN A1C: 4.9 % (ref 4.8–5.6)
MEAN PLASMA GLUCOSE: 94 mg/dL

## 2016-07-30 LAB — THYROID PANEL
FREE THYROXINE INDEX: 1.9 (ref 1.2–4.9)
T3 Uptake Ratio: 28 % (ref 24–39)
T4, Total: 6.9 ug/dL (ref 4.5–12.0)

## 2016-07-30 MED ORDER — CARBAMIDE PEROXIDE 10 % MT SOLN
Freq: Three times a day (TID) | OROMUCOSAL | Status: DC
  Filled 2016-07-30: qty 15

## 2016-07-30 MED ORDER — AMOXICILLIN-POT CLAVULANATE 875-125 MG PO TABS
1.0000 | ORAL_TABLET | Freq: Two times a day (BID) | ORAL | Status: DC
  Administered 2016-07-30 – 2016-08-03 (×9): 1 via ORAL
  Filled 2016-07-30 (×9): qty 1

## 2016-07-30 MED ORDER — ORAL CARE MOUTH RINSE
15.0000 mL | Freq: Three times a day (TID) | OROMUCOSAL | Status: DC
  Administered 2016-07-30 – 2016-08-03 (×11): 15 mL via OROMUCOSAL

## 2016-07-30 MED ORDER — CYANOCOBALAMIN 1000 MCG/ML IJ SOLN
1000.0000 ug | Freq: Every day | INTRAMUSCULAR | Status: AC
  Administered 2016-07-30 – 2016-08-03 (×5): 1000 ug via SUBCUTANEOUS
  Filled 2016-07-30 (×5): qty 1

## 2016-07-30 NOTE — Progress Notes (Signed)
Patient pleasant & cooperative on approach.Rated her depression 2/10.Denies suicidal or homicidal ideations & AV hallucinations.Appropriate with staff & peers.Attended groups.Appetite & energy level good.Compliant with medications.Support & encouragement given.

## 2016-07-30 NOTE — Discharge Summary (Signed)
[facility] - [place] at [facility]   PATIENT NAME: Shelby Odonnell    MR#:  [id number]  DATE OF BIRTH:  Mar 11, 1971  DATE OF ADMISSION:  07/26/2016   ADMITTING PHYSICIAN: Ihor Austin, MD  DATE OF DISCHARGE: 07/28/2016 12:39 PM  PRIMARY CARE PHYSICIAN: [facility]   ADMISSION DIAGNOSIS:   Seizure (HCC) [R56.9] Intentional drug overdose, initial encounter (HCC) [T50.902A]  DISCHARGE DIAGNOSIS:   Principal Problem:   Severe recurrent major depression without psychotic features (HCC) Active Problems:   Overdose of antidepressant, intentional self-harm, initial encounter (HCC)   SECONDARY DIAGNOSIS:   History reviewed. No pertinent past medical history.  HOSPITAL COURSE:   46 y/o M with PMH of Depression on effexor admitted with an intentional overdose of 150mg  effexor extended release tabs.  #1 major depressive disorder with suicidal attempt-Drug overdose- effexor overdose- extended release tabs - Patient was monitored on telemetry for cardiac arrhythmias, and also neurological complications. -She did have 2 episodes of seizures since admission. Last seizure was more than 24 hours ago -Appreciate neurology consult. CT of the head is negative, EEG is negative for any seizure focus. -No QRS widening or QT prolongation on the last EKG. -Appreciate psych consult. Patient will be discharged to behavioral medicine unit for treatment of her depression.  -Effexor is currently on hold  #2 Seizures- secondary to effexor overdose- Resolved within 24 hours of intake of Effexor overdose. Poison control has been in contact throughout her medical hospitalization  #3 Asthma-stable, prn inhaler. Cough medications added.  #4 hyperlipidemia-continue Fish oil capsule and simvastatin  Patient will be discharged to behavioral medicine unit as she is medically stable at this time   DISCHARGE CONDITIONS:   Guarded  CONSULTS OBTAINED:   Treatment Team:    Thana Farr, MD Audery Amel, MD  DRUG ALLERGIES:   Allergies  Allergen Reactions  . Fluoxetine Other (See Comments)    Other Reaction: OTHER REACTION, onSet: 09/03/2009  . Tetracyclines & Related Rash   DISCHARGE MEDICATIONS:   Allergies as of 07/28/2016      Reactions   Fluoxetine Other (See Comments)   Other Reaction: OTHER REACTION, onSet: 09/03/2009   Tetracyclines & Related Rash      Medication List    STOP taking these medications   norethindrone-ethinyl estradiol 1-20 MG-MCG tablet Commonly known as:  JUNEL FE,GILDESS FE,LOESTRIN FE   venlafaxine 75 MG tablet Commonly known as:  EFFEXOR     TAKE these medications   albuterol 108 (90 Base) MCG/ACT inhaler Commonly known as:  PROVENTIL HFA;VENTOLIN HFA Inhale 2 puffs into the lungs daily as needed.   ascorbic acid 250 MG tablet Commonly known as:  VITAMIN C Take 250 mg by mouth daily.   benzonatate 200 MG capsule Commonly known as:  TESSALON Take 1 capsule (200 mg total) by mouth 3 (three) times daily.   FISH OIL PO Take 1 capsule by mouth daily.   guaiFENesin 100 MG/5ML Soln Commonly known as:  ROBITUSSIN Take 10 mLs (200 mg total) by mouth every 4 (four) hours as needed for cough or to loosen phlegm.   mometasone-formoterol 100-5 MCG/ACT Aero Commonly known as:  DULERA Inhale 2 puffs into the lungs 2 (two) times daily.   simvastatin 20 MG tablet Commonly known as:  ZOCOR Take 20 mg by mouth daily.        DISCHARGE INSTRUCTIONS:   1. Will be discharged to behavioral medicine unit  DIET:   Regular diet  ACTIVITY:  Activity as tolerated  OXYGEN:   Home Oxygen: No.  Oxygen Delivery: room air  DISCHARGE LOCATION:   [place] [facility]  If you experience worsening of your admission symptoms, develop shortness of breath, life threatening emergency, suicidal or homicidal thoughts you must seek medical attention immediately by calling 911 or calling your MD  immediately  if symptoms less severe.  You Must read complete instructions/literature along with all the possible adverse reactions/side effects for all the Medicines you take and that have been prescribed to you. Take any new Medicines after you have completely understood and accpet all the possible adverse reactions/side effects.   Please note  You were cared for by a hospitalist during your hospital stay. If you have any questions about your discharge medications or the care you received while you were in the hospital after you are discharged, you can call the unit and asked to speak with the hospitalist on call if the hospitalist that took care of you is not available. Once you are discharged, your primary care physician will handle any further medical issues. Please note that NO REFILLS for any discharge medications will be authorized once you are discharged, as it is imperative that you return to your primary care physician (or establish a relationship with a primary care physician if you do not have one) for your aftercare needs so that they can reassess your need for medications and monitor your lab values.    On the day of Discharge:  VITAL SIGNS:   Blood pressure (!) 98/56, pulse (!) 103, temperature 99 F (37.2 C), temperature source Oral, resp. rate 18, height 5\' 5"  (1.651 m), weight 75.8 kg (167 lb), last menstrual period 07/20/2016, SpO2 91 %.  PHYSICAL EXAMINATION:    GENERAL:  46 y.o.-year-old patient lying in the bed with no acute distress.  EYES: Pupils equal, round, reactive to light and accommodation. No scleral icterus. Extraocular muscles intact.  HEENT: Head atraumatic, normocephalic. Oropharynx and nasopharynx clear.  NECK:  Supple, no jugular venous distention. No thyroid enlargement, no tenderness.  LUNGS: Normal breath sounds bilaterally, no wheezing, rales,rhonchi or crepitation. No use of accessory muscles of respiration. Decreased bibasilar breath  sounds CARDIOVASCULAR: S1, S2 normal. No murmurs, rubs, or gallops.  ABDOMEN: Soft, nontender, nondistended. Bowel sounds present. No organomegaly or mass.  EXTREMITIES: No pedal edema, cyanosis, or clubbing.  NEUROLOGIC: Cranial nerves II through XII are intact. Muscle strength 5/5 in all extremities. Sensation intact. Gait not checked. Able to move all extremities in bed PSYCHIATRIC: The patient is alert , Oriented 3. Slow to respond.. Occasionally getting confused SKIN: No obvious rash, lesion, or ulcer.     DATA REVIEW:   CBC  Recent Labs Lab 07/27/16 0550  WBC 11.2*  HGB 12.1  HCT 34.3*  PLT 271    Chemistries   Recent Labs Lab 07/26/16 2143  07/27/16 1427 07/28/16 0641  NA 137  < >  --  139  K 3.9  < >  --  3.7  CL 106  < >  --  110  CO2 24  < >  --  22  GLUCOSE 88  < >  --  92  BUN 10  < >  --  7  CREATININE 0.73  < >  --  0.61  CALCIUM 8.5*  < >  --  8.1*  MG  --   --  1.9  --   AST 26  --   --   --  ALT 24  --   --   --   ALKPHOS 53  --   --   --   BILITOT 0.5  --   --   --   < > = values in this interval not displayed.   Microbiology Results  No results found for this or any previous visit.  RADIOLOGY:  Dg Chest 2 View  Result Date: 07/29/2016 CLINICAL DATA:  Pneumonia EXAM: CHEST  2 VIEW COMPARISON:  None. FINDINGS: Small bilateral pleural effusions, left greater than right. The right lung is clear. Mild atelectasis or infiltrate at the left lung base. Normal heart size. No pneumothorax. IMPRESSION: Small left greater than right pleural effusions with left basilar atelectasis or infiltrate. Electronically Signed   By: Jasmine Pang M.D.   On: 07/29/2016 15:14     Management plans discussed with the patient, family and they are in agreement.  CODE STATUS:  Code Status History    Date Active Date Inactive Code Status Order ID Comments User Context   07/27/2016  5:27 AM 07/28/2016 12:39 PM Full Code [id number]  Ihor Austin, MD Inpatient       TOTAL TIME TAKING CARE OF THIS PATIENT: 38 minutes.    Enid Baas M.D on 07/30/2016 at 8:10 AM  Between 7am to 6pm - Pager - [phone]  After 6pm go to [email or web address] - Social research officer, government  [facility] [place] [facility]  Office  [phone]  CC: Primary care physician; [facility]   Note: This dictation was prepared with Dragon dictation along with smaller phrase technology. Any transcriptional errors that result from this process are unintentional.

## 2016-07-30 NOTE — Progress Notes (Signed)
D: Pt denies SI/HI/AVH. Pt is pleasant and cooperative, affect is flat and sad but brightens upon approach. Patient rated Depression a 5 on a scale ( 0 -10)  depression . Pt appears less anxious and she is interacting with peers and staff appropriately.  A: Pt was offered support and encouragement. Pt was given scheduled medications. Pt was encouraged to attend groups. Q 15 minute checks were done for safety.  R:Pt attends groups and interacts well with peers and staff. Pt is taking medication. Pt has no complaints.Pt receptive to treatment and safety maintained on unit.

## 2016-07-30 NOTE — Tx Team (Signed)
Interdisciplinary Treatment and Diagnostic Plan Update  07/30/2016 Time of Session: 11:00am Shelby Odonnell MRN: [id number]  Principal Diagnosis: Severe recurrent major depression without psychotic features [facility])  Secondary Diagnoses: Principal Problem:   Severe recurrent major depression without psychotic features (HCC) Active Problems:   Overdose of antidepressant, intentional self-harm, initial encounter (HCC)   Tobacco use disorder   Alcohol use disorder, mild, abuse   Current Medications:  Current Facility-Administered Medications  Medication Dose Route Frequency Provider Last Rate Last Dose  . acetaminophen (TYLENOL) tablet 1,000 mg  1,000 mg Oral Q6H PRN Jimmy Footman, MD      . albuterol (PROVENTIL HFA;VENTOLIN HFA) 108 (90 Base) MCG/ACT inhaler 2 puff  2 puff Inhalation Q4H PRN Audery Amel, MD      . alum & mag hydroxide-simeth (MAALOX/MYLANTA) 200-200-20 MG/5ML suspension 30 mL  30 mL Oral Q4H PRN Audery Amel, MD      . amoxicillin-clavulanate (AUGMENTIN) 875-125 MG per tablet 1 tablet  1 tablet Oral Q12H Jimmy Footman, MD   1 tablet at 07/30/16 1232  . benzonatate (TESSALON) capsule 200 mg  200 mg Oral TID PRN Jimmy Footman, MD      . cyanocobalamin ((VITAMIN B-12)) injection 1,000 mcg  1,000 mcg Subcutaneous Daily Jimmy Footman, MD   1,000 mcg at 07/30/16 1232  . feeding supplement (ENSURE ENLIVE) (ENSURE ENLIVE) liquid 237 mL  237 mL Oral TID BM Jimmy Footman, MD   237 mL at 07/29/16 2100  . ibuprofen (ADVIL,MOTRIN) tablet 600 mg  600 mg Oral Q6H PRN Jimmy Footman, MD      . LORazepam (ATIVAN) tablet 0.25 mg  0.25 mg Oral TID Jimmy Footman, MD   0.25 mg at 07/30/16 1233  . magnesium hydroxide (MILK OF MAGNESIA) suspension 30 mL  30 mL Oral Daily PRN Audery Amel, MD      . MEDLINE mouth rinse  15 mL Mouth Rinse TID PC & HS Jimmy Footman, MD      . multivitamin with minerals  tablet 1 tablet  1 tablet Oral Daily Jimmy Footman, MD   1 tablet at 07/30/16 0809  . nicotine (NICODERM CQ - dosed in mg/24 hours) patch 21 mg  21 mg Transdermal Daily Jimmy Footman, MD   21 mg at 07/29/16 1211   PTA Medications: Prescriptions Prior to Admission  Medication Sig Dispense Refill Last Dose  . albuterol (PROVENTIL HFA;VENTOLIN HFA) 108 (90 Base) MCG/ACT inhaler Inhale 2 puffs into the lungs daily as needed.     Marland Kitchen ascorbic acid (VITAMIN C) 250 MG tablet Take 250 mg by mouth daily.     . benzonatate (TESSALON) 200 MG capsule Take 1 capsule (200 mg total) by mouth 3 (three) times daily. 20 capsule 0   . guaiFENesin (ROBITUSSIN) 100 MG/5ML SOLN Take 10 mLs (200 mg total) by mouth every 4 (four) hours as needed for cough or to loosen phlegm. 1200 mL 0   . mometasone-formoterol (DULERA) 100-5 MCG/ACT AERO Inhale 2 puffs into the lungs 2 (two) times daily. 1 Inhaler 0   . Omega-3 Fatty Acids (FISH OIL PO) Take 1 capsule by mouth daily.     . simvastatin (ZOCOR) 20 MG tablet Take 20 mg by mouth daily.       Patient Stressors: Financial difficulties Medication change or noncompliance Substance abuse  Patient Strengths: Ability for insight Active sense of humor Capable of independent living Supportive family/friends  Treatment Modalities: Medication Management, Group therapy, Case management,  1 to 1 session with  clinician, Psychoeducation, Recreational therapy.   Physician Treatment Plan for Primary Diagnosis: Severe recurrent major depression without psychotic features (HCC) Long Term Goal(s): Improvement in symptoms so as ready for discharge Improvement in symptoms so as ready for discharge   Short Term Goals: Ability to verbalize feelings will improve Ability to demonstrate self-control will improve Ability to identify and develop effective coping behaviors will improve Ability to identify changes in lifestyle to reduce recurrence of condition will  improve Ability to disclose and discuss suicidal ideas  Medication Management: Evaluate patient's response, side effects, and tolerance of medication regimen.  Therapeutic Interventions: 1 to 1 sessions, Unit Group sessions and Medication administration.  Evaluation of Outcomes: Progressing  Physician Treatment Plan for Secondary Diagnosis: Principal Problem:   Severe recurrent major depression without psychotic features (HCC) Active Problems:   Overdose of antidepressant, intentional self-harm, initial encounter (HCC)   Tobacco use disorder   Alcohol use disorder, mild, abuse  Long Term Goal(s): Improvement in symptoms so as ready for discharge Improvement in symptoms so as ready for discharge   Short Term Goals: Ability to verbalize feelings will improve Ability to demonstrate self-control will improve Ability to identify and develop effective coping behaviors will improve Ability to identify changes in lifestyle to reduce recurrence of condition will improve Ability to disclose and discuss suicidal ideas     Medication Management: Evaluate patient's response, side effects, and tolerance of medication regimen.  Therapeutic Interventions: 1 to 1 sessions, Unit Group sessions and Medication administration.  Evaluation of Outcomes: Progressing   RN Treatment Plan for Primary Diagnosis: Severe recurrent major depression without psychotic features (HCC) Long Term Goal(s): Knowledge of disease and therapeutic regimen to maintain health will improve  Short Term Goals: Ability to remain free from injury will improve, Ability to participate in decision making will improve, Ability to verbalize feelings will improve, Ability to identify and develop effective coping behaviors will improve and Compliance with prescribed medications will improve  Medication Management: RN will administer medications as ordered by provider, will assess and evaluate patient's response and provide education to  patient for prescribed medication. RN will report any adverse and/or side effects to prescribing provider.  Therapeutic Interventions: 1 on 1 counseling sessions, Psychoeducation, Medication administration, Evaluate responses to treatment, Monitor vital signs and CBGs as ordered, Perform/monitor CIWA, COWS, AIMS and Fall Risk screenings as ordered, Perform wound care treatments as ordered.  Evaluation of Outcomes: Progressing   LCSW Treatment Plan for Primary Diagnosis: Severe recurrent major depression without psychotic features (HCC) Long Term Goal(s): Safe transition to appropriate next level of care at discharge, Engage patient in therapeutic group addressing interpersonal concerns.  Short Term Goals: Engage patient in aftercare planning with referrals and resources, Increase social support, Increase ability to appropriately verbalize feelings, Identify triggers associated with mental health/substance abuse issues and Increase skills for wellness and recovery  Therapeutic Interventions: Assess for all discharge needs, 1 to 1 time with Social worker, Explore available resources and support systems, Assess for adequacy in community support network, Educate family and significant other(s) on suicide prevention, Complete Psychosocial Assessment, Interpersonal group therapy.  Evaluation of Outcomes: Progressing   Progress in Treatment: Attending groups: Yes. Participating in groups: Yes. Taking medication as prescribed: Yes. Toleration medication: Yes. Family/Significant other contact made: No, will contact:  husband Patient understands diagnosis: Yes. Discussing patient identified problems/goals with staff: Yes. Medical problems stabilized or resolved: No. Denies suicidal/homicidal ideation: Yes. Issues/concerns per patient self-inventory: No. Other: n/a  New problem(s) identified: None identified at  this time.  New Short Term/Long Term Goal(s): Goals identified by patient during  treatment team: "I don't know".   Discharge Plan or Barriers: Patient will discharge home with husband and follow-up with outpatient services.   Reason for Continuation of Hospitalization: Depression Medical Issues Medication stabilization  Estimated Length of Stay: 5 to 7 days.   Attendees: Patient: Shelby Odonnell 07/30/2016 1:18 PM  Physician: Dr. Radene JourneyAndrea HernandezJayme Cloud- Gonzalez, MD 07/30/2016 1:18 PM  Nursing: Leonarda SalonGiGi George Maniattu, RN 07/30/2016 1:18 PM  RN Care Manager: 07/30/2016 1:18 PM  Social Worker: Fredrich BirksAmaris G. Garnette CzechSampson MSW, LCSWA 07/30/2016 1:18 PM  Recreational Therapist:  07/30/2016 1:18 PM  Other:  07/30/2016 1:18 PM  Other:  07/30/2016 1:18 PM  Other: 07/30/2016 1:18 PM    Scribe for Treatment Team: Arelia LongestAmaris G Sekai Nayak, LCSWA 07/30/2016  1:21 PM

## 2016-07-30 NOTE — Progress Notes (Signed)
[facility] MD Progress Note  07/30/2016 12:15 PM Shelby Odonnell  MRN:  [id number] Subjective:  Patient is a 46 year old married Caucasian female from [place]. Patient presented to our emergency department via EMS on March 19 after a suicidal attempt by overdose on Effexor.  Per EMS, pt was locked in a room for about half an hour and they had to break the door down to get to her. She took several handfuls of 150 mg extended release Effexor.  Patient was transferred to the medical floor. She had a total of 3 seizures.  She reports that about 4 years ago she was started on Effexor by her primary care provider for depression. She says that the Effexor has been very helpful for her. She started having some marital issues about a year ago and since then her mood started to decline.  Patient explains she found out her husband was meeting women online. One of his relationships became very serious and he asked his wife if she was willing to have an open marriage. The woman he was dating was originally from [place] and she ended up moving 6 miles away from their home.  The woman evidentially left [place] and went back to [place] in February. She told that the relationship between her husband and this woman was going to fade away however he was trying to make arrangements to go and visit her in [place].  On the day of the overdose patient states that nothing special or stressful happened. She feels the overdose was a very impulsive act. She denied having suicidal thoughts prior to that they. Patient denies any current suicidal ideation. She is glad to be alive. She realizes how damaging this could have been of her children. She denies any auditory or visual hallucinations and denies having any homicidal ideation. For several weeks prior to admission she was displaying of depressed mood, decreased sleep and poor energy poor appetite and poor concentration. She said that she lost about 15 pounds in the  last 8 months.  Patient denies any use of illicit substances. She drinks about 4 shots of liquor 2 times a week. She denies the use of any prescription medications. She smokes about one pack of cigarettes per day.  3/23 patient reports she is no longer feeling depressed. She is wondering when could she go home. She appears to be minimizing the depression and severity of suicidal attempts. Clinically she looks quite dysphoric and blunted. She is even as stating that she doesn't think she needs an antidepressant right now.  Patient denies SI, HI or auditory or visual hallucinations. Says that she slept fairly okay last night. She continues to complain of having poor energy poor concentration and appetite.  Physically she feels feels very weak and achy. She continues to have shortness of breath.   Per nursing: Patient stated slept fair last night .Stated appetite is fair and energy level  low. Stated concentration is good . Stated on Depression scale 6 , hopeless 0 and anxiety 7 .( low 0-10 high) Denies suicidal  homicidal ideations  .  No auditory hallucinations  No pain concerns . Appropriate ADL'S. Interacting with peers and staff. Limited interaction with peers . Attending unit programing  Continue to voice of all over muscle soreness  Principal Problem: Severe recurrent major depression without psychotic features (HCC) Diagnosis:   Patient Active Problem List   Diagnosis Date Noted  . Tobacco use disorder [F17.200] 07/29/2016  . Alcohol use disorder, mild, abuse [F10.10]  07/29/2016  . Severe recurrent major depression without psychotic features (HCC) [F33.2] 07/27/2016  . Overdose of antidepressant, intentional self-harm, initial encounter [facility]) [T43.202A] 07/27/2016   Total Time spent with patient: 30 minutes  Past Psychiatric History:Patient denies prior psychiatric hospitalizations, or suicidal attempts. Patient does report history of cutting as a teenager but has not cut since then.  Patient was diagnosed with depression and prescribed with Effexor by her primary care provider 4 years ago. She has never seen a psychiatrist or a therapist.   Past Medical History: History reviewed. No pertinent past medical history.  Past Surgical History:  Procedure Laterality Date  . CESAREAN SECTION     Family History:  Family History  Problem Relation Age of Onset  . Colon cancer Father    Family Psychiatric  History: Reports her sister suffers from bipolar disorder. There is no history of suicidal attempts or substance abuse in her family   Social History: Patient is originally from [place]. She has a high school education. She works at [employer] has been there for several years. She does secretarial work for them.  Patient has been married twice. She has been with her husband for 15 years. She has 2 adult children from a different relationship they are 55 and 48 years old. They're currently living with her.  Patient denies any legal problems. Denies any military history.  Patient is close with her younger sister, they both work at the same place. She is does not have a close relationship with her older sister or her mother. Her father passed away about 15 years ago due to colon cancer. History  Alcohol Use  . Yes     History  Drug Use No    Social History   Social History  . Marital status: Married    Spouse name: N/A  . Number of children: N/A  . Years of education: N/A   Social History Main Topics  . Smoking status: Current Every Day Smoker    Packs/day: 1.00    Types: Cigarettes  . Smokeless tobacco: Never Used  . Alcohol use Yes  . Drug use: No  . Sexual activity: Not Asked   Other Topics Concern  . None   Social History Narrative  . None     Current Medications: Current Facility-Administered Medications  Medication Dose Route Frequency Provider Last Rate Last Dose  . acetaminophen (TYLENOL) tablet 1,000 mg  1,000 mg Oral Q6H PRN Jimmy Footman, MD      . albuterol (PROVENTIL HFA;VENTOLIN HFA) 108 (90 Base) MCG/ACT inhaler 2 puff  2 puff Inhalation Q4H PRN Audery Amel, MD      . alum & mag hydroxide-simeth (MAALOX/MYLANTA) 200-200-20 MG/5ML suspension 30 mL  30 mL Oral Q4H PRN Audery Amel, MD      . amoxicillin-clavulanate (AUGMENTIN) 875-125 MG per tablet 1 tablet  1 tablet Oral Q12H Jimmy Footman, MD      . benzonatate (TESSALON) capsule 200 mg  200 mg Oral TID PRN Jimmy Footman, MD      . carbamide peroxide (GLY-OXIDE) 10 % mouth solution   dental TID PC & HS Jimmy Footman, MD      . cyanocobalamin ((VITAMIN B-12)) injection 1,000 mcg  1,000 mcg Subcutaneous Daily Jimmy Footman, MD      . feeding supplement (ENSURE ENLIVE) (ENSURE ENLIVE) liquid 237 mL  237 mL Oral TID BM Jimmy Footman, MD   237 mL at 07/29/16 2100  . ibuprofen (ADVIL,MOTRIN) tablet 600  mg  600 mg Oral Q6H PRN Jimmy Footman, MD      . LORazepam (ATIVAN) tablet 0.25 mg  0.25 mg Oral TID Jimmy Footman, MD   0.25 mg at 07/30/16 0809  . magnesium hydroxide (MILK OF MAGNESIA) suspension 30 mL  30 mL Oral Daily PRN Audery Amel, MD      . multivitamin with minerals tablet 1 tablet  1 tablet Oral Daily Jimmy Footman, MD   1 tablet at 07/30/16 0809  . nicotine (NICODERM CQ - dosed in mg/24 hours) patch 21 mg  21 mg Transdermal Daily Jimmy Footman, MD   21 mg at 07/29/16 1211    Lab Results:  Results for orders placed or performed during the hospital encounter of 07/28/16 (from the past 48 hour(s))  Hemoglobin A1c     Status: None   Collection Time: 07/29/16  6:45 AM  Result Value Ref Range   Hgb A1c MFr Bld 4.9 4.8 - 5.6 %    Comment: (NOTE)         Pre-diabetes: 5.7 - 6.4         Diabetes: >6.4         Glycemic control for adults with diabetes: <7.0    Mean Plasma Glucose 94 mg/dL    Comment: (NOTE) Performed At: [facility] [address] [place], [place] [id number] Mila Homer MD WU:[id number]   Lipid panel     Status: None   Collection Time: 07/29/16  6:45 AM  Result Value Ref Range   Cholesterol 132 0 - 200 mg/dL   Triglycerides 60 <956 mg/dL   HDL 45 >21 mg/dL   Total CHOL/HDL Ratio 2.9 RATIO   VLDL 12 0 - 40 mg/dL   LDL Cholesterol 75 0 - 99 mg/dL    Comment:        Total Cholesterol/HDL:CHD Risk Coronary Heart Disease Risk Table                     Men   Women  1/2 Average Risk   3.4   3.3  Average Risk       5.0   4.4  2 X Average Risk   9.6   7.1  3 X Average Risk  23.4   11.0        Use the calculated Patient Ratio above and the CHD Risk Table to determine the patient's CHD Risk.        ATP III CLASSIFICATION (LDL):  <100     mg/dL   Optimal  308-657  mg/dL   Near or Above                    Optimal  130-159  mg/dL   Borderline  846-962  mg/dL   High  >952     mg/dL   Very High   TSH     Status: Abnormal   Collection Time: 07/29/16  6:45 AM  Result Value Ref Range   TSH 7.053 (H) 0.350 - 4.500 uIU/mL    Comment: Performed by a 3rd Generation assay with a functional sensitivity of <=0.01 uIU/mL.  Vitamin B12     Status: None   Collection Time: 07/29/16  1:30 PM  Result Value Ref Range   Vitamin B-12 226 180 - 914 pg/mL    Comment: (NOTE) This assay is not validated for testing neonatal or myeloproliferative syndrome specimens for Vitamin B12 levels. Performed at [facility], [address] [place], [place] [address]   Thyroid Panel     Status: None   Collection Time: 07/29/16  1:30 PM  Result Value Ref Range   T4, Total 6.9 4.5 - 12.0 ug/dL   T3 Uptake Ratio 28 24 - 39 %   Free Thyroxine Index 1.9 1.2 - 4.9    Comment: (NOTE) Performed At: [facility] [address] [place], [place] [id number] Mila HomerHancock William F MD AO:[id number]:[phone]   Rapid HIV screen (HIV 1/2 Ab+Ag)     Status: None   Collection Time: 07/29/16  1:30 PM  Result Value Ref  Range   HIV-1 P24 Antigen - HIV24 NON REACTIVE NON REACTIVE   HIV 1/2 Antibodies NON REACTIVE NON REACTIVE   Interpretation (HIV Ag Ab)      A non reactive test result means that HIV 1 or HIV 2 antibodies and HIV 1 p24 antigen were not detected in the specimen.    Blood Alcohol level:  No results found for: [facility]  Metabolic Disorder Labs: Lab Results  Component Value Date   HGBA1C 4.9 07/29/2016   MPG 94 07/29/2016   No results found for: PROLACTIN Lab Results  Component Value Date   CHOL 132 07/29/2016   TRIG 60 07/29/2016   HDL 45 07/29/2016   CHOLHDL 2.9 07/29/2016   VLDL 12 07/29/2016   LDLCALC 75 07/29/2016    Physical Findings: AIMS:  , ,  ,  ,    CIWA:    COWS:     Musculoskeletal: Strength & Muscle Tone: within normal limits Gait & Station: normal Patient leans: N/A  Psychiatric Specialty Exam: Physical Exam  Constitutional: She is oriented to person, place, and time. She appears well-developed and well-nourished.  HENT:  Head: Normocephalic and atraumatic.  Eyes: Conjunctivae and EOM are normal.  Neck: Normal range of motion.  Respiratory: Effort normal.  Musculoskeletal: Normal range of motion.  Neurological: She is alert and oriented to person, place, and time.    Review of Systems  Constitutional: Positive for malaise/fatigue.  HENT: Negative.   Eyes: Negative.   Respiratory: Negative.   Cardiovascular: Negative.   Gastrointestinal: Negative.   Genitourinary: Negative.   Musculoskeletal: Positive for myalgias.  Skin: Negative.   Neurological: Positive for weakness.  Endo/Heme/Allergies: Negative.   Psychiatric/Behavioral: Positive for depression and substance abuse. Negative for hallucinations, memory loss and suicidal ideas. The patient is not nervous/anxious and does not have insomnia.     Blood pressure 106/78, pulse 84, temperature 98.4 F (36.9 C), temperature source Oral, resp. rate 18, height 5\' 5"  (1.651 m), weight 81.2 kg (179 lb),  last menstrual period 07/20/2016, SpO2 93 %.Body mass index is 29.79 kg/m.  General Appearance: Disheveled  Eye Contact:  Good  Speech:  Slow  Volume:  Decreased  Mood:  Dysphoric  Affect:  Blunt  Thought Process:  Linear and Descriptions of Associations: Intact  Orientation:  Full (Time, Place, and Person)  Thought Content:  Hallucinations: None  Suicidal Thoughts:  No  Homicidal Thoughts:  No  Memory:  Immediate;   Fair Recent;   Poor Remote;   Good  Judgement:  Poor  Insight:  Shallow  Psychomotor Activity:  Decreased  Concentration:  Concentration: Fair and Attention Span: Fair  Recall:  [employer]Fair  Fund of Knowledge:  Good  Language:  Good  Akathisia:  No  Handed:    AIMS (if indicated):     Assets:  Communication Skills Physical Health  ADL's:  Intact  Cognition:  WNL  Sleep:  Number of Hours: 5     Treatment Plan Summary:  Patient is a 46 year old married Caucasian female with major depressive disorder. The patient was admitted to our unit after a serious overdose on Effexor.  Currently the patient states she is glad to be  alive. She denies having suicidal ideation at this time.  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  MDD: Patient has serious overdose on Effexor on March 19. At this time I will prefer not to start her on any antidepressants. Possibly could be started on fluoxetine or sertraline on Monday  Anxiety: for now continue Ativan 0.25 mg 3 times day to target possible withdrawal from effexor  Insomnia: continue vistaril prn  Low B12 level: will order b12 1000 mg [place] for 5 days  Poor appetite: does not want ensure.  Will continue multivitamins   Tobacco use disorder: continue nicotine patch 21 mg a day  Aspiration pneumonia: will start Augmentin q 12 h for 7 days  Cough: continue Tessalon Perles when necessary  Mouth ulcer: bit toungue  during seizure.  Will order peroxide mouth wash qid  Vital  signs daily  Diet regular  Precautions every 15 minute checks  Hospitalization status involuntary commitment  Labs:  thyroid panel t3 and t4 wnl, B12 lowand HIV neg.  Records from care everywhere were reviewed. There is no evidence of prior psychiatric hospitalizations. Patient sees primary care at [facility] in [place].  Will contact husband today: [phone]  Jimmy Footman, MD 07/30/2016, 12:15 PM

## 2016-07-30 NOTE — [facility] Suicide Risk Assessment (Signed)
[facility] INPATIENT:  Family/Significant Other Suicide Prevention Education  Suicide Prevention Education:  Education Completed;Jason Tarr(husband [phone]), has been identified by the patient as the family member/significant other with whom the patient will be residing, and identified as the person(s) who will aid the patient in the event of a mental health crisis (suicidal ideations/suicide attempt).  With written consent from the patient, the family member/significant other has been provided the following suicide prevention education, prior to the and/or following the discharge of the patient.  The suicide prevention education provided includes the following:  Suicide risk factors  Suicide prevention and interventions  National Suicide Hotline telephone number  [facility] assessment telephone number  [facility] Emergency Assistance 911  [facility] and/or Residential Mobile Crisis Unit telephone number  Request made of family/significant other to:  Remove weapons (e.g., guns, rifles, knives), all items previously/currently identified as safety concern.    Remove drugs/medications (over-the-counter, prescriptions, illicit drugs), all items previously/currently identified as a safety concern.  The family member/significant other verbalizes understanding of the suicide prevention education information provided.  The family member/significant other agrees to remove the items of safety concern listed above.  Narek Kniss G. Garnette CzechSampson MSW, LCSWA 07/30/2016, 4:00 PM

## 2016-07-30 NOTE — [facility] Group Notes (Signed)
[facility] LCSW Group Therapy Note  Date/Time: 07/30/16, 0930  Type of Therapy and Topic:  Group Therapy:  Feelings around Relapse and Recovery  Participation Level:  Active   Mood: Pleasant  Description of Group:    Patients in this group will discuss emotions they experience before and after a relapse. They will process how experiencing these feelings, or avoidance of experiencing them, relates to having a relapse. Facilitator will guide patients to explore emotions they have related to recovery. Patients will be encouraged to process which emotions are more powerful. They will be guided to discuss the emotional reaction significant others in their lives may have to patients' relapse or recovery. Patients will be assisted in exploring ways to respond to the emotions of others without this contributing to a relapse.  Therapeutic Goals: 1. Patient will identify two or more emotions that lead to relapse for them:  2. Patient will identify two emotions that result when they relapse:  3. Patient will identify two emotions related to recovery:  4. Patient will demonstrate ability to communicate their needs through discussion and/or role plays.   Summary of Patient Progress: Pt was more active in group today.  She shared that feeling "not good enough" is a difficult emotion that can lead to relapse.  She was attentive and engaged throughout group today.     Therapeutic Modalities:   Cognitive Behavioral Therapy Solution-Focused Therapy Assertiveness Training Relapse Prevention Therapy  Daleen SquibbGreg Shaketta Rill, LCSW

## 2016-07-30 NOTE — [facility] Group Notes (Signed)
[facility] Group Notes:  (Nursing/MHT/Case Management/Adjunct)  Date:  07/30/2016  Time:  4:25 AM  Type of Therapy:  Psychoeducational Skills  Participation Level:  Active  Participation Quality:  Appropriate and Sharing  Affect:  Appropriate  Cognitive:  Alert and Appropriate  Insight:  Appropriate and Good  Engagement in Group:  Engaged  Modes of Intervention:  Discussion, Socialization and Support  Summary of Progress/Problems:  Shelby MilroyLaquanda Y Haelyn Odonnell 07/30/2016, 4:25 AM

## 2016-07-31 NOTE — [facility] Group Notes (Signed)
[facility] LCSW Group Therapy  07/31/2016 1:59 PM  Type of Therapy:  Group Therapy  Participation Level:  Active  Participation Quality:  Attentive  Affect:  Appropriate  Cognitive:  Alert  Insight:  Lacking and Limited  Engagement in Therapy:  Improving  Modes of Intervention:  Clarification, Discussion, Education, Exploration, Orientation, Problem-solving, Reality Testing, Socialization and Support  Summary of Progress/Problems: .Goal Setting: The objective is to set goals as they relate to the crisis in which they were admitted. Patients will be using SMART goal modalities to set measurable goals. Characteristics of realistic goals will be discussed and patients will be assisted in setting and processing how one will reach their goal. Facilitator will also assist patients in applying interventions and coping skills learned in psycho-education groups to the SMART goal and process how one will achieve defined goal. Patient stated her goal was to get off all depression medications. CSW provided support to patient and discussed working on new coping skills in groups.   Demmi Sindt G. Garnette CzechSampson MSW, LCSWA 07/31/2016, 2:06 PM

## 2016-07-31 NOTE — Progress Notes (Signed)
D: Patient stated slept fair last night .Stated appetite is fair and energy level  poor. Stated concentration poor . Stated on Depression scale 2 , hopeless 0 and anxiety 0 .( low 0-10 high) Denies suicidal  homicidal ideations  .  No auditory hallucinations  No pain concerns . Appropriate ADL'S. Interacting with peers and staff. Visited by family and friends . Affect sad and depressed. Instructed to work on Pharmacologistcoping skills . Handout given A: Encourage patient participation with unit programming . Instruction  Given on  Medication , verbalize understanding. R: Voice no other concerns. Staff continue to monitor

## 2016-07-31 NOTE — Plan of Care (Signed)
Problem: Safety: Goal: Ability to remain free from injury will improve Outcome: Progressing Patient  Able to voice of any concerns . Compliant with unit programing

## 2016-07-31 NOTE — Progress Notes (Signed)
[facility] MD Progress Note  07/31/2016 10:49 AM Shelby Odonnell  MRN:  [id number] Subjective:  Patient is a 46 year old married Caucasian female from [place]. Patient presented to our emergency department via EMS on March 19 after a suicidal attempt by overdose on handful of Effexor, had 3 seizures.   Pt in dayroom. Pleasant cooperative with depressed affect, guarded. Patient stated slept well  last night .Stated appetite is fair and energy level  low. Stated concentration is good . Reports depression and anxiety improving, does not want to be on any antidepressant at this time. Denies suicidal  homicidal ideations  . Denies auditory hallucinations  No pain concerns . Appropriate ADL'S. Limited interaction with peers . Attending unit programing     Principal Problem: Severe recurrent major depression without psychotic features [facility]) Diagnosis:   Patient Active Problem List   Diagnosis Date Noted  . Tobacco use disorder [F17.200] 07/29/2016  . Alcohol use disorder, mild, abuse [F10.10] 07/29/2016  . Severe recurrent major depression without psychotic features (HCC) [F33.2] 07/27/2016  . Overdose of antidepressant, intentional self-harm, initial encounter [facility]) [T43.202A] 07/27/2016   Total Time spent with patient: 30 minutes  Past Psychiatric History:Patient denies prior psychiatric hospitalizations, or suicidal attempts. Patient does report history of cutting as a teenager but has not cut since then. Patient was diagnosed with depression and prescribed with Effexor by her primary care provider 4 years ago. She has never seen a psychiatrist or a therapist.   Past Medical History: History reviewed. No pertinent past medical history.  Past Surgical History:  Procedure Laterality Date  . CESAREAN SECTION     Family History:  Family History  Problem Relation Age of Onset  . Colon cancer Father    Family Psychiatric  History: Reports her sister suffers from bipolar disorder. There is no  history of suicidal attempts or substance abuse in her family   Social History: Patient is originally from [place]. She has a high school education. She works at [employer] has been there for several years. She does secretarial work for them.  Patient has been married twice. She has been with her husband for 15 years. She has 2 adult children from a different relationship they are 44 and 48 years old. They're currently living with her.  Patient denies any legal problems. Denies any military history.  Patient is close with her younger sister, they both work at the same place. She is does not have a close relationship with her older sister or her mother. Her father passed away about 15 years ago due to colon cancer. History  Alcohol Use  . Yes     History  Drug Use No    Social History   Social History  . Marital status: Married    Spouse name: N/A  . Number of children: N/A  . Years of education: N/A   Social History Main Topics  . Smoking status: Current Every Day Smoker    Packs/day: 1.00    Types: Cigarettes  . Smokeless tobacco: Never Used  . Alcohol use Yes  . Drug use: No  . Sexual activity: Not Asked   Other Topics Concern  . None   Social History Narrative  . None     Current Medications: Current Facility-Administered Medications  Medication Dose Route Frequency Provider Last Rate Last Dose  . acetaminophen (TYLENOL) tablet 1,000 mg  1,000 mg Oral Q6H PRN Jimmy Footman, MD      . albuterol (PROVENTIL HFA;VENTOLIN HFA)  108 (90 Base) MCG/ACT inhaler 2 puff  2 puff Inhalation Q4H PRN Audery Amel, MD      . alum & mag hydroxide-simeth (MAALOX/MYLANTA) 200-200-20 MG/5ML suspension 30 mL  30 mL Oral Q4H PRN Audery Amel, MD      . amoxicillin-clavulanate (AUGMENTIN) 875-125 MG per tablet 1 tablet  1 tablet Oral Q12H Jimmy Footman, MD   1 tablet at 07/31/16 0751  . benzonatate (TESSALON) capsule 200 mg  200 mg Oral TID PRN Jimmy Footman, MD      . cyanocobalamin ((VITAMIN B-12)) injection 1,000 mcg  1,000 mcg Subcutaneous Daily Jimmy Footman, MD   1,000 mcg at 07/31/16 0753  . feeding supplement (ENSURE ENLIVE) (ENSURE ENLIVE) liquid 237 mL  237 mL Oral TID BM Jimmy Footman, MD   237 mL at 07/29/16 2100  . ibuprofen (ADVIL,MOTRIN) tablet 600 mg  600 mg Oral Q6H PRN Jimmy Footman, MD      . LORazepam (ATIVAN) tablet 0.25 mg  0.25 mg Oral TID Jimmy Footman, MD   0.25 mg at 07/31/16 0751  . magnesium hydroxide (MILK OF MAGNESIA) suspension 30 mL  30 mL Oral Daily PRN Audery Amel, MD      . MEDLINE mouth rinse  15 mL Mouth Rinse TID PC & HS Jimmy Footman, MD   15 mL at 07/31/16 0845  . multivitamin with minerals tablet 1 tablet  1 tablet Oral Daily Jimmy Footman, MD   1 tablet at 07/31/16 0751  . nicotine (NICODERM CQ - dosed in mg/24 hours) patch 21 mg  21 mg Transdermal Daily Jimmy Footman, MD   21 mg at 07/29/16 1211    Lab Results:  Results for orders placed or performed during the hospital encounter of 07/28/16 (from the past 48 hour(s))  Vitamin B12     Status: None   Collection Time: 07/29/16  1:30 PM  Result Value Ref Range   Vitamin B-12 226 180 - 914 pg/mL    Comment: (NOTE) This assay is not validated for testing neonatal or myeloproliferative syndrome specimens for Vitamin B12 levels. Performed at [facility], [address], [place], [place] [address]   Thyroid Panel     Status: None   Collection Time: 07/29/16  1:30 PM  Result Value Ref Range   T4, Total 6.9 4.5 - 12.0 ug/dL   T3 Uptake Ratio 28 24 - 39 %   Free Thyroxine Index 1.9 1.2 - 4.9    Comment: (NOTE) Performed At: [facility] [address] [place], [place] [id number] Mila Homer MD [id number]   Rapid HIV screen (HIV 1/2 Ab+Ag)     Status: None   Collection Time: 07/29/16  1:30 PM  Result Value Ref Range   HIV-1  P24 Antigen - HIV24 NON REACTIVE NON REACTIVE   HIV 1/2 Antibodies NON REACTIVE NON REACTIVE   Interpretation (HIV Ag Ab)      A non reactive test result means that HIV 1 or HIV 2 antibodies and HIV 1 p24 antigen were not detected in the specimen.    Blood Alcohol level:  No results found for: [facility]  Metabolic Disorder Labs: Lab Results  Component Value Date   HGBA1C 4.9 07/29/2016   MPG 94 07/29/2016   No results found for: PROLACTIN Lab Results  Component Value Date   CHOL 132 07/29/2016   TRIG 60 07/29/2016   HDL 45 07/29/2016   CHOLHDL 2.9 07/29/2016   VLDL 12 07/29/2016   LDLCALC  75 07/29/2016    Physical Findings: AIMS:  , ,  ,  ,    CIWA:    COWS:     Musculoskeletal: Strength & Muscle Tone: within normal limits Gait & Station: normal Patient leans: N/A  Psychiatric Specialty Exam: Physical Exam  Nursing note and vitals reviewed. Constitutional: She is oriented to person, place, and time. She appears well-developed and well-nourished.  HENT:  Head: Normocephalic and atraumatic.  Eyes: Conjunctivae and EOM are normal.  Neck: Normal range of motion.  Respiratory: Effort normal.  Musculoskeletal: Normal range of motion.  Neurological: She is alert and oriented to person, place, and time.    Review of Systems  Constitutional: Positive for malaise/fatigue.  HENT: Negative.   Eyes: Negative.   Respiratory: Negative.   Cardiovascular: Negative.   Gastrointestinal: Negative.   Genitourinary: Negative.   Musculoskeletal: Positive for myalgias.  Skin: Negative.   Neurological: Positive for weakness.  Endo/Heme/Allergies: Negative.   Psychiatric/Behavioral: Positive for depression and substance abuse. Negative for hallucinations, memory loss and suicidal ideas. The patient is not nervous/anxious and does not have insomnia.     Blood pressure 121/70, pulse 81, temperature 99 F (37.2 C), temperature source Oral, resp. rate 18, height 5\' 5"  (1.651 m), weight  81.2 kg (179 lb), last menstrual period 07/20/2016, SpO2 93 %.Body mass index is 29.79 kg/m.  General Appearance: Disheveled  Eye Contact:  poor  Speech:  Slow  Volume:  Decreased  Mood:  Dysphoric  Affect:  Blunt  Thought Process:  Linear and Descriptions of Associations: Intact  Orientation:  Full (Time, Place, and Person)  Thought Content:  Hallucinations: None  Suicidal Thoughts:  No  Homicidal Thoughts:  No  Memory:  Immediate;   Fair Recent;   Poor Remote;   Good  Judgement:  Poor  Insight:  Shallow  Psychomotor Activity:  Decreased  Concentration:  Concentration: Fair and Attention Span: Fair  Recall:  [employer]Fair  Fund of Knowledge:  Good  Language:  Good  Akathisia:  No  Handed:    AIMS (if indicated):     Assets:  Communication Skills Physical Health  ADL's:  Intact  Cognition:  WNL  Sleep:  Number of Hours: 7.45     Treatment Plan Summary:  Patient is a 46 year old married Caucasian female with major depressive disorder. The patient was admitted to our unit after a serious overdose on Effexor.   She denies having suicidal ideation at this time.   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  MDD: Patient has serious overdose on Effexor on March 19. At this time I will prefer not to start her on any antidepressants. Possibly could be started on fluoxetine or sertraline on Monday. Pt not willing to take any antidepressant at this time.   Anxiety: for now continue Ativan 0.25 mg 3 times day to target possible withdrawal from effexor  Insomnia: slept well last night, continue vistaril prn   Poor appetite: does not want ensure.  Will continue multivitamins   Tobacco use disorder: continue nicotine patch 21 mg a day  Aspiration pneumonia: cont Augmentin q 12 h for total of 7 days  Cough: continue Tessalon Perles when necessary  Mouth ulcer: bit toungue  during seizure.  Will order peroxide mouth wash qid  Vital signs daily  Diet  regular  Precautions every 15 minute checks  Hospitalization status involuntary commitment  Labs:  thyroid panel t3 and t4 wnl, B12 lowand HIV neg.  Patient sees primary care at [facility] in [place].   Beverly Sessions, MD 07/31/2016, 10:49 AMPatient ID: Shelby Odonnell, female   DOB: 20-Feb-1971, 46 y.o.   MRN: [id number]

## 2016-08-01 NOTE — Progress Notes (Signed)
[facility] MD Progress Note  08/01/2016 10:52 AM Shelby Odonnell  MRN:  [id number] Subjective:  Patient is a 46 year old married Caucasian female from [place]. Patient presented to our emergency department via EMS on March 19 after a suicidal attempt by overdose on handful of Effexor, had 3 seizures.   Pt in dayroom. Pleasant cooperative with depressed affect, guarded. Patient  slept well  last night .,  does not want to be on any antidepressant at this time, states she is "not depressed". Denies suicidal  homicidal ideations  . Denies auditory hallucinations  No pain concerns . Appropriate ADL'S.  Attending unit programing   Per nursing-  Patient has had an uneventfull night; Patient visited with a friend; D: Patient stated slept fair last night .Stated appetite is fair and energy level  poor. Stated concentration poor . Stated on Depression scale 2 , hopeless 0 and anxiety 0 .( low 0-10 high) Denies suicidal  homicidal ideations  .  No auditory hallucinations  No pain concerns . Appropriate ADL'S. Interacting with peers and staff. Visited by family and friends . Affect sad and depressed. Instructed to work on Pharmacologist . Handout given A: Encourage patient participation with unit programming . Instruction  Given on  Medication , verbalize understanding. R: Voice no other concerns. Staff continue to monitor   Principal Problem: Severe recurrent major depression without psychotic features [facility]) Diagnosis:   Patient Active Problem List   Diagnosis Date Noted  . Tobacco use disorder [F17.200] 07/29/2016  . Alcohol use disorder, mild, abuse [F10.10] 07/29/2016  . Severe recurrent major depression without psychotic features (HCC) [F33.2] 07/27/2016  . Overdose of antidepressant, intentional self-harm, initial encounter [facility]) [T43.202A] 07/27/2016   Total Time spent with patient: 30 minutes  Past Psychiatric History:Patient denies prior psychiatric hospitalizations, or suicidal attempts. Patient  does report history of cutting as a teenager but has not cut since then. Patient was diagnosed with depression and prescribed with Effexor by her primary care provider 4 years ago. She has never seen a psychiatrist or a therapist.   Past Medical History: History reviewed. No pertinent past medical history.  Past Surgical History:  Procedure Laterality Date  . CESAREAN SECTION     Family History:  Family History  Problem Relation Age of Onset  . Colon cancer Father    Family Psychiatric  History: Reports her sister suffers from bipolar disorder. There is no history of suicidal attempts or substance abuse in her family   Social History: Patient is originally from [place]. She has a high school education. She works at [employer] has been there for several years. She does secretarial work for them.  Patient has been married twice. She has been with her husband for 15 years. She has 2 adult children from a different relationship they are 91 and 48 years old. They're currently living with her.  Patient denies any legal problems. Denies any military history.  Patient is close with her younger sister, they both work at the same place. She is does not have a close relationship with her older sister or her mother. Her father passed away about 15 years ago due to colon cancer. History  Alcohol Use  . Yes     History  Drug Use No    Social History   Social History  . Marital status: Married    Spouse name: N/A  . Number of children: N/A  . Years of education: N/A   Social History Main Topics  .  Smoking status: Current Every Day Smoker    Packs/day: 1.00    Types: Cigarettes  . Smokeless tobacco: Never Used  . Alcohol use Yes  . Drug use: No  . Sexual activity: Not Asked   Other Topics Concern  . None   Social History Narrative  . None     Current Medications: Current Facility-Administered Medications  Medication Dose Route Frequency Provider Last Rate Last Dose  .  acetaminophen (TYLENOL) tablet 1,000 mg  1,000 mg Oral Q6H PRN Jimmy FootmanAndrea Hernandez-Gonzalez, MD      . albuterol (PROVENTIL HFA;VENTOLIN HFA) 108 (90 Base) MCG/ACT inhaler 2 puff  2 puff Inhalation Q4H PRN Audery AmelJohn T Clapacs, MD      . alum & mag hydroxide-simeth (MAALOX/MYLANTA) 200-200-20 MG/5ML suspension 30 mL  30 mL Oral Q4H PRN Audery AmelJohn T Clapacs, MD      . amoxicillin-clavulanate (AUGMENTIN) 875-125 MG per tablet 1 tablet  1 tablet Oral Q12H Jimmy FootmanAndrea Hernandez-Gonzalez, MD   1 tablet at 08/01/16 [phone]  . benzonatate (TESSALON) capsule 200 mg  200 mg Oral TID PRN Jimmy FootmanAndrea Hernandez-Gonzalez, MD      . cyanocobalamin ((VITAMIN B-12)) injection 1,000 mcg  1,000 mcg Subcutaneous Daily Jimmy FootmanAndrea Hernandez-Gonzalez, MD   1,000 mcg at 08/01/16 [id number]  . feeding supplement (ENSURE ENLIVE) (ENSURE ENLIVE) liquid 237 mL  237 mL Oral TID BM Jimmy FootmanAndrea Hernandez-Gonzalez, MD   237 mL at 07/29/16 2100  . ibuprofen (ADVIL,MOTRIN) tablet 600 mg  600 mg Oral Q6H PRN Jimmy FootmanAndrea Hernandez-Gonzalez, MD      . LORazepam (ATIVAN) tablet 0.25 mg  0.25 mg Oral TID Jimmy FootmanAndrea Hernandez-Gonzalez, MD   0.25 mg at 08/01/16 0824  . magnesium hydroxide (MILK OF MAGNESIA) suspension 30 mL  30 mL Oral Daily PRN Audery AmelJohn T Clapacs, MD      . MEDLINE mouth rinse  15 mL Mouth Rinse TID PC & HS Jimmy FootmanAndrea Hernandez-Gonzalez, MD   15 mL at 08/01/16 0923  . multivitamin with minerals tablet 1 tablet  1 tablet Oral Daily Jimmy FootmanAndrea Hernandez-Gonzalez, MD   1 tablet at 08/01/16 [id number]  . nicotine (NICODERM CQ - dosed in mg/24 hours) patch 21 mg  21 mg Transdermal Daily Jimmy FootmanAndrea Hernandez-Gonzalez, MD   21 mg at 07/29/16 1211    Lab Results:  No results found for this or any previous visit (from the past 48 hour(s)).  Blood Alcohol level:  [facility] found [facility]: [facility]  Metabolic Disorder Labs: Lab Results  Component Value Date   HGBA1C 4.9 07/29/2016   MPG 94 07/29/2016   No results found for: PROLACTIN Lab Results  Component Value Date   CHOL 132 07/29/2016   TRIG 60  07/29/2016   HDL 45 07/29/2016   CHOLHDL 2.9 07/29/2016   VLDL 12 07/29/2016   LDLCALC 75 07/29/2016    Physical Findings: AIMS:  , ,  ,  ,    CIWA:    COWS:     Musculoskeletal: Strength & Muscle Tone: within normal limits Gait & Station: normal Patient leans: N/A  Psychiatric Specialty Exam: Physical Exam  Nursing note and vitals reviewed. Constitutional: She is oriented to person, place, and time. She appears well-developed and well-nourished.  HENT:  Head: Normocephalic and atraumatic.  Eyes: EOM are normal.  Neck: Normal range of motion.  Respiratory: Effort normal.  Musculoskeletal: Normal range of motion.  Neurological: She is alert and oriented to person, place, and time.    Review of Systems  Constitutional: Positive for malaise/fatigue.  HENT: Negative.   Eyes:  Negative.   Respiratory: Negative.   Cardiovascular: Negative.   Gastrointestinal: Negative.   Genitourinary: Negative.   Musculoskeletal: Positive for myalgias.  Skin: Negative.   Neurological: Positive for weakness.  Endo/Heme/Allergies: Negative.   Psychiatric/Behavioral: Positive for depression and substance abuse. Negative for hallucinations, memory loss and suicidal ideas. The patient is not nervous/anxious and does not have insomnia.     Blood pressure 116/66, pulse 67, temperature 98.7 F (37.1 C), temperature source Oral, resp. rate 18, height 5\' 5"  (1.651 m), weight 81.2 kg (179 lb), last menstrual period 07/20/2016, SpO2 93 %.Body mass index is 29.79 kg/m.  General Appearance: Disheveled  Eye Contact:  poor  Speech:  Slow  Volume:  Decreased  Mood:  Dysphoric  Affect:  Blunt  Thought Process:  Linear and Descriptions of Associations: Intact  Orientation:  Full (Time, Place, and Person)  Thought Content:  Hallucinations: None  Suicidal Thoughts:  No  Homicidal Thoughts:  No  Memory:  Immediate;   Fair Recent;   Poor Remote;   Good  Judgement:  Poor  Insight:  Shallow   Psychomotor Activity:  Decreased  Concentration:  Concentration: Fair and Attention Span: Fair  Recall:  [employer] of Knowledge:  Good  Language:  Good  Akathisia:  No  Handed:    AIMS (if indicated):     Assets:  Communication Skills Physical Health  ADL's:  Intact  Cognition:  WNL  Sleep:  Number of Hours: 6.45     Treatment Plan Summary:  Patient is a 45 year old married Caucasian female with major depressive disorder. The patient was admitted to our unit after a serious overdose on Effexor.   She denies having suicidal ideation at this time.   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  MDD: Patient has serious overdose on Effexor on March 19. At this time I will prefer not to start her on any antidepressants. Possibly could be started on fluoxetine or sertraline on Monday. Pt not willing to take any antidepressant at this time.   Anxiety: for now continue Ativan 0.25 mg 3 times day to target possible withdrawal from effexor  Insomnia: slept well last night, continue vistaril prn   Poor appetite: does not want ensure.  Will continue multivitamins   Tobacco use disorder: continue nicotine patch 21 mg a day  Aspiration pneumonia: cont Augmentin q 12 h for total of 7 days  Cough: continue Tessalon Perles when necessary  Mouth ulcer: bit toungue  during seizure.  Will order peroxide mouth wash qid  Vital signs daily  Diet regular  Precautions every 15 minute checks  Hospitalization status involuntary commitment  Labs:  thyroid panel t3 and t4 wnl, B12 lowand HIV neg.   Patient sees primary care at [facility] in [place].   Beverly Sessions, MD 08/01/2016, 10:52 AMPatient ID: Shelby Odonnell, female   DOB: 1970/09/25, 46 y.o.   MRN: [id number] Patient ID: Shelby Odonnell, female   DOB: 19-Aug-1970, 46 y.o.   MRN: [id number]

## 2016-08-01 NOTE — Progress Notes (Signed)
Patient is pleasant & cooperative on approach.Patient rated her depression & anxiety 0/10.Denies suicidal or homicidal ideations.Stated that she is ready to go home.Appropriate with staff & peers.Compliant with medications.Attended groups.Appetite & energy level good.Support & encouragement given.

## 2016-08-01 NOTE — [facility] Group Notes (Signed)
[facility] Group Notes:  (Nursing/MHT/Case Management/Adjunct)  Date:  08/01/2016  Time:  2:20 AM  Type of Therapy:  Psychoeducational Skills  Participation Level:  Active  Participation Quality:  Appropriate and Sharing  Affect:  Appropriate  Cognitive:  Appropriate  Insight:  Appropriate and Good  Engagement in Group:  Engaged  Modes of Intervention:  Discussion, Socialization and Support  Summary of Progress/Problems:  Shelby MilroyLaquanda Y Jag Odonnell 08/01/2016, 2:20 AM

## 2016-08-01 NOTE — Progress Notes (Signed)
Patient has had an uneventfull night; Patient visited with a friend; and patient says that; the visit was too short; and stated that; she is looking forward to discharge. Patient also stated that; she and her friend have been working on their issues; and that; she wants to learn better ways of coping. Patient denies having suicidal/homicidal ideations; denies having any auditory/visual hallucinations /delusions; continue to monitor patient's status.

## 2016-08-01 NOTE — [facility] Group Notes (Signed)
[facility] LCSW Group Therapy  08/01/2016 2:04 PM  Type of Therapy:  Group Therapy  Participation Level:  Active  Participation Quality:  Attentive  Affect:  Appropriate  Cognitive:  Alert  Insight:  Improving  Engagement in Therapy:  Limited  Modes of Intervention:  Activity, Discussion, Education, Problem-solving, Reality Testing, Socialization and Support  Summary of Progress/Problems: Stress management: Patients defined and discussed the topic of stress and the related symptoms and triggers for stress. Patients identified healthy coping skills they would like to try during hospitalization and after discharge to manage stress in a healthy way. CSW offered insight to varying stress management techniques. Patient participated in the mindfulness mediation and was able to identify areas of stress. Patient discuss how stress effects her emotionally causing irritability or depression. CSW provided support and discuss how mindfulness mediation could be utilized as a Associate Professorcoping skill.   Shelby Odonnell G. Garnette CzechSampson MSW, LCSWA 08/01/2016, 2:21 PM

## 2016-08-02 DIAGNOSIS — Z79899 Other long term (current) drug therapy: Secondary | ICD-10-CM

## 2016-08-02 DIAGNOSIS — F1010 Alcohol abuse, uncomplicated: Secondary | ICD-10-CM

## 2016-08-02 DIAGNOSIS — F17210 Nicotine dependence, cigarettes, uncomplicated: Secondary | ICD-10-CM

## 2016-08-02 DIAGNOSIS — G4700 Insomnia, unspecified: Secondary | ICD-10-CM

## 2016-08-02 NOTE — [facility] Group Notes (Signed)
[facility] LCSW Group Therapy   08/02/2016 9:30 am Type of Therapy: Group Therapy   Participation Level: Active   Participation Quality: Attentive, Sharing and Supportive   Affect: Appropriate   Cognitive: Alert and Oriented   Insight: Developing/Improving and Engaged   Engagement in Therapy: Developing/Improving and Engaged   Modes of Intervention: Clarification, Confrontation, Discussion, Education, Exploration,  Limit-setting, Orientation, Problem-solving, Rapport Building, Dance movement psychotherapisteality Testing, Socialization and Support   Summary of Progress/Problems: Pt identified obstacles faced currently and processed barriers involved in overcoming these obstacles. Pt identified steps necessary for overcoming these obstacles and explored motivation (internal and external) for facing these difficulties head on. Pt further identified one area of concern in their lives and chose a goal to focus on for today. Pt defined an obstacle as "things that get in your way." She stated that relationships and lack of communication led to her hospitalization. Pt identified healthy coping mechanisms.     Hampton AbbotKadijah Montrelle Eddings, MSW, LCSWA 08/02/2016, 10:21AM

## 2016-08-02 NOTE — Plan of Care (Signed)
Problem: Health Behavior/Discharge Planning: Goal: Ability to manage health-related needs will improve Outcome: Progressing Patient up, visible in milieu. Providing self adl care.

## 2016-08-02 NOTE — Progress Notes (Signed)
Patient ID: Shelby DossJudy G Odonnell, female   DOB: 06/08/1970, 46 y.o.   MRN: [id number] A&Ox3, denied pain, bright affect and mood, appropriate in appearance and behavior, denied SI/HI, denied AV/H; bed in low position, floor matt bedside, yellow armband visible to alert other clinicians of potential for falls/injury. Visitation by husband, Barbara CowerJason, went well; will continue with POC.

## 2016-08-02 NOTE — Plan of Care (Signed)
Problem: Activity: Goal: Sleeping patterns will improve Outcome: Progressing Reported having a hard time falling to sleep, but slept 7 hours 15 minutes.

## 2016-08-02 NOTE — Tx Team (Signed)
Interdisciplinary Treatment and Diagnostic Plan Update  08/02/2016 Time of Session: 11:30am Shelby Odonnell MRN: [id number]  Principal Diagnosis: Severe recurrent major depression without psychotic features [facility])  Secondary Diagnoses: Principal Problem:   Severe recurrent major depression without psychotic features (HCC) Active Problems:   Overdose of antidepressant, intentional self-harm, initial encounter (HCC)   Tobacco use disorder   Alcohol use disorder, mild, abuse   Current Medications:  Current Facility-Administered Medications  Medication Dose Route Frequency Provider Last Rate Last Dose  . acetaminophen (TYLENOL) tablet 1,000 mg  1,000 mg Oral Q6H PRN Jimmy Footman, MD      . albuterol (PROVENTIL HFA;VENTOLIN HFA) 108 (90 Base) MCG/ACT inhaler 2 puff  2 puff Inhalation Q4H PRN Audery Amel, MD      . alum & mag hydroxide-simeth (MAALOX/MYLANTA) 200-200-20 MG/5ML suspension 30 mL  30 mL Oral Q4H PRN Audery Amel, MD      . amoxicillin-clavulanate (AUGMENTIN) 875-125 MG per tablet 1 tablet  1 tablet Oral Q12H Jimmy Footman, MD   1 tablet at 08/02/16 0752  . benzonatate (TESSALON) capsule 200 mg  200 mg Oral TID PRN Jimmy Footman, MD      . cyanocobalamin ((VITAMIN B-12)) injection 1,000 mcg  1,000 mcg Subcutaneous Daily Jimmy Footman, MD   1,000 mcg at 08/02/16 0753  . feeding supplement (ENSURE ENLIVE) (ENSURE ENLIVE) liquid 237 mL  237 mL Oral TID BM Jimmy Footman, MD   237 mL at 07/29/16 2100  . ibuprofen (ADVIL,MOTRIN) tablet 600 mg  600 mg Oral Q6H PRN Jimmy Footman, MD      . LORazepam (ATIVAN) tablet 0.25 mg  0.25 mg Oral TID Jimmy Footman, MD   0.25 mg at 08/02/16 0753  . magnesium hydroxide (MILK OF MAGNESIA) suspension 30 mL  30 mL Oral Daily PRN Audery Amel, MD      . MEDLINE mouth rinse  15 mL Mouth Rinse TID PC & HS Jimmy Footman, MD   15 mL at 08/02/16 1300  .  multivitamin with minerals tablet 1 tablet  1 tablet Oral Daily Jimmy Footman, MD   1 tablet at 08/02/16 0753  . nicotine (NICODERM CQ - dosed in mg/24 hours) patch 21 mg  21 mg Transdermal Daily Jimmy Footman, MD   21 mg at 07/29/16 1211   PTA Medications: Prescriptions Prior to Admission  Medication Sig Dispense Refill Last Dose  . albuterol (PROVENTIL HFA;VENTOLIN HFA) 108 (90 Base) MCG/ACT inhaler Inhale 2 puffs into the lungs daily as needed.     Marland Kitchen ascorbic acid (VITAMIN C) 250 MG tablet Take 250 mg by mouth daily.     . benzonatate (TESSALON) 200 MG capsule Take 1 capsule (200 mg total) by mouth 3 (three) times daily. 20 capsule 0   . guaiFENesin (ROBITUSSIN) 100 MG/5ML SOLN Take 10 mLs (200 mg total) by mouth every 4 (four) hours as needed for cough or to loosen phlegm. 1200 mL 0   . mometasone-formoterol (DULERA) 100-5 MCG/ACT AERO Inhale 2 puffs into the lungs 2 (two) times daily. 1 Inhaler 0   . Omega-3 Fatty Acids (FISH OIL PO) Take 1 capsule by mouth daily.     . simvastatin (ZOCOR) 20 MG tablet Take 20 mg by mouth daily.       Patient Stressors: Financial difficulties Medication change or noncompliance Substance abuse  Patient Strengths: Ability for insight Active sense of humor Capable of independent living Supportive family/friends  Treatment Modalities: Medication Management, Group therapy, Case management,  1 to  1 session with clinician, Psychoeducation, Recreational therapy.   Physician Treatment Plan for Primary Diagnosis: Severe recurrent major depression without psychotic features (HCC) Long Term Goal(s): Improvement in symptoms so as ready for discharge Improvement in symptoms so as ready for discharge   Short Term Goals: Ability to verbalize feelings will improve Ability to demonstrate self-control will improve Ability to identify and develop effective coping behaviors will improve Ability to identify changes in lifestyle to reduce  recurrence of condition will improve Ability to disclose and discuss suicidal ideas  Medication Management: Evaluate patient's response, side effects, and tolerance of medication regimen.  Therapeutic Interventions: 1 to 1 sessions, Unit Group sessions and Medication administration.  Evaluation of Outcomes: Progressing  Physician Treatment Plan for Secondary Diagnosis: Principal Problem:   Severe recurrent major depression without psychotic features (HCC) Active Problems:   Overdose of antidepressant, intentional self-harm, initial encounter (HCC)   Tobacco use disorder   Alcohol use disorder, mild, abuse  Long Term Goal(s): Improvement in symptoms so as ready for discharge Improvement in symptoms so as ready for discharge   Short Term Goals: Ability to verbalize feelings will improve Ability to demonstrate self-control will improve Ability to identify and develop effective coping behaviors will improve Ability to identify changes in lifestyle to reduce recurrence of condition will improve Ability to disclose and discuss suicidal ideas     Medication Management: Evaluate patient's response, side effects, and tolerance of medication regimen.  Therapeutic Interventions: 1 to 1 sessions, Unit Group sessions and Medication administration.  Evaluation of Outcomes: Progressing   RN Treatment Plan for Primary Diagnosis: Severe recurrent major depression without psychotic features (HCC) Long Term Goal(s): Knowledge of disease and therapeutic regimen to maintain health will improve  Short Term Goals: Ability to remain free from injury will improve, Ability to participate in decision making will improve, Ability to verbalize feelings will improve, Ability to identify and develop effective coping behaviors will improve and Compliance with prescribed medications will improve  Medication Management: RN will administer medications as ordered by provider, will assess and evaluate patient's  response and provide education to patient for prescribed medication. RN will report any adverse and/or side effects to prescribing provider.  Therapeutic Interventions: 1 on 1 counseling sessions, Psychoeducation, Medication administration, Evaluate responses to treatment, Monitor vital signs and CBGs as ordered, Perform/monitor CIWA, COWS, AIMS and Fall Risk screenings as ordered, Perform wound care treatments as ordered.  Evaluation of Outcomes: Progressing   LCSW Treatment Plan for Primary Diagnosis: Severe recurrent major depression without psychotic features (HCC) Long Term Goal(s): Safe transition to appropriate next level of care at discharge, Engage patient in therapeutic group addressing interpersonal concerns.  Short Term Goals: Engage patient in aftercare planning with referrals and resources, Increase social support, Increase ability to appropriately verbalize feelings, Identify triggers associated with mental health/substance abuse issues and Increase skills for wellness and recovery  Therapeutic Interventions: Assess for all discharge needs, 1 to 1 time with Social worker, Explore available resources and support systems, Assess for adequacy in community support network, Educate family and significant other(s) on suicide prevention, Complete Psychosocial Assessment, Interpersonal group therapy.  Evaluation of Outcomes: Progressing   Progress in Treatment: Attending groups: Yes. Participating in groups: Yes. Taking medication as prescribed: Yes. Toleration medication: Yes. Family/Significant other contact made: No, will contact:  husband Patient understands diagnosis: Yes. Discussing patient identified problems/goals with staff: Yes. Medical problems stabilized or resolved: No. Denies suicidal/homicidal ideation: Yes. Issues/concerns per patient self-inventory: No. Other: n/a  New problem(s) identified:  None identified at this time.  New Short Term/Long Term Goal(s): Goals  identified by patient during treatment team: "I don't know".   Discharge Plan or Barriers: Patient will discharge home with husband and follow-up with outpatient services.   Reason for Continuation of Hospitalization: Depression Medical Issues Medication stabilization  Estimated Length of Stay: 3 days.   Attendees: Patient:  08/02/2016   Physician: Dr. Daleen Bo, MD 08/02/2016   Nursing: Leonia Reader, RN 08/02/2016   RN Care Manager:  08/02/2016   Social Worker: Daleen Squibb, LCSW 08/02/2016   Recreational Therapist: Hershal Coria, LRT/CTRS  08/02/2016   Other:  08/02/2016   Other:  08/02/2016   Other: 08/02/2016     Scribe for Treatment Team: Lorri Frederick, LCSW 08/02/2016  2:08 PM

## 2016-08-02 NOTE — Plan of Care (Signed)
Problem: Self-Concept: Goal: Ability to disclose and discuss suicidal ideas will improve Outcome: Progressing Denies suicidal ideation. Improvement noted, brighter.

## 2016-08-02 NOTE — [facility] Group Notes (Signed)
  [facility] Group Notes:  (Nursing/MHT/Case Management/Adjunct)  Date:  08/02/2016  Time:  12:12 AM  Type of Therapy:  Psychoeducational Skills  Participation Level:  Active  Participation Quality:  Appropriate and Attentive  Affect:  Appropriate  Cognitive:  Alert  Insight:  Good  Engagement in Group:  Engaged  Modes of Intervention:  Discussion  Summary of Progress/Problems:  [name] R [name] 08/02/2016, 12:12 AM

## 2016-08-02 NOTE — Progress Notes (Signed)
[facility] MD Progress Note  08/02/2016 11:27 AM Shelby Odonnell  MRN:  [id number] Subjective:  Patient is a 46 year old married Caucasian female from [place]. Patient presented to our emergency department via EMS on March 19 after a suicidal attempt by overdose on handful of Effexor, had 3 seizures.   Patient seen this morning on the unit. She reports that she has been doing well. She does appear quite anxious and becomes tearful. States that she really wants to be discharged. She reports to this physician that she does not want to take any antidepressants currently. States that she and her husband are planning to see a marriage Veterinary surgeon. Ports fair sleep and appetite. Per staff she has been much better than when she first presented to the unit. She has been ambulatory and attending groups. She has not been disruptive on the unit or engaging in any inappropriate behaviors.  Principal Problem: Severe recurrent major depression without psychotic features (HCC) Diagnosis:   Patient Active Problem List   Diagnosis Date Noted  . Tobacco use disorder [F17.200] 07/29/2016  . Alcohol use disorder, mild, abuse [F10.10] 07/29/2016  . Severe recurrent major depression without psychotic features (HCC) [F33.2] 07/27/2016  . Overdose of antidepressant, intentional self-harm, initial encounter [facility]) [T43.202A] 07/27/2016   Total Time spent with patient: 30 minutes  Past Psychiatric History:Patient denies prior psychiatric hospitalizations, or suicidal attempts. Patient does report history of cutting as a teenager but has not cut since then. Patient was diagnosed with depression and prescribed with Effexor by her primary care provider 4 years ago. She has never seen a psychiatrist or a therapist.   Past Medical History: History reviewed. No pertinent past medical history.  Past Surgical History:  Procedure Laterality Date  . CESAREAN SECTION     Family History:  Family History  Problem Relation Age of  Onset  . Colon cancer Father    Family Psychiatric  History: Reports her sister suffers from bipolar disorder. There is no history of suicidal attempts or substance abuse in her family   Social History: Patient is originally from [place]. She has a high school education. She works at [employer] has been there for several years. She does secretarial work for them.  Patient has been married twice. She has been with her husband for 15 years. She has 2 adult children from a different relationship they are 22 and 48 years old. They're currently living with her.  Patient denies any legal problems. Denies any military history.  Patient is close with her younger sister, they both work at the same place. She is does not have a close relationship with her older sister or her mother. Her father passed away about 15 years ago due to colon cancer. History  Alcohol Use  . Yes     History  Drug Use No    Social History   Social History  . Marital status: Married    Spouse name: N/A  . Number of children: N/A  . Years of education: N/A   Social History Main Topics  . Smoking status: Current Every Day Smoker    Packs/day: 1.00    Types: Cigarettes  . Smokeless tobacco: Never Used  . Alcohol use Yes  . Drug use: No  . Sexual activity: Not Asked   Other Topics Concern  . None   Social History Narrative  . None     Current Medications: Current Facility-Administered Medications  Medication Dose Route Frequency Provider Last Rate Last Dose  .  acetaminophen (TYLENOL) tablet 1,000 mg  1,000 mg Oral Q6H PRN Jimmy FootmanAndrea Hernandez-Gonzalez, MD      . albuterol (PROVENTIL HFA;VENTOLIN HFA) 108 (90 Base) MCG/ACT inhaler 2 puff  2 puff Inhalation Q4H PRN Audery AmelJohn T Clapacs, MD      . alum & mag hydroxide-simeth (MAALOX/MYLANTA) 200-200-20 MG/5ML suspension 30 mL  30 mL Oral Q4H PRN Audery AmelJohn T Clapacs, MD      . amoxicillin-clavulanate (AUGMENTIN) 875-125 MG per tablet 1 tablet  1 tablet Oral Q12H Jimmy FootmanAndrea  Hernandez-Gonzalez, MD   1 tablet at 08/02/16 0752  . benzonatate (TESSALON) capsule 200 mg  200 mg Oral TID PRN Jimmy FootmanAndrea Hernandez-Gonzalez, MD      . cyanocobalamin ((VITAMIN B-12)) injection 1,000 mcg  1,000 mcg Subcutaneous Daily Jimmy FootmanAndrea Hernandez-Gonzalez, MD   1,000 mcg at 08/02/16 0753  . feeding supplement (ENSURE ENLIVE) (ENSURE ENLIVE) liquid 237 mL  237 mL Oral TID BM Jimmy FootmanAndrea Hernandez-Gonzalez, MD   237 mL at 07/29/16 2100  . ibuprofen (ADVIL,MOTRIN) tablet 600 mg  600 mg Oral Q6H PRN Jimmy FootmanAndrea Hernandez-Gonzalez, MD      . LORazepam (ATIVAN) tablet 0.25 mg  0.25 mg Oral TID Jimmy FootmanAndrea Hernandez-Gonzalez, MD   0.25 mg at 08/02/16 0753  . magnesium hydroxide (MILK OF MAGNESIA) suspension 30 mL  30 mL Oral Daily PRN Audery AmelJohn T Clapacs, MD      . MEDLINE mouth rinse  15 mL Mouth Rinse TID PC & HS Jimmy FootmanAndrea Hernandez-Gonzalez, MD   15 mL at 08/02/16 0900  . multivitamin with minerals tablet 1 tablet  1 tablet Oral Daily Jimmy FootmanAndrea Hernandez-Gonzalez, MD   1 tablet at 08/02/16 0753  . nicotine (NICODERM CQ - dosed in mg/24 hours) patch 21 mg  21 mg Transdermal Daily Jimmy FootmanAndrea Hernandez-Gonzalez, MD   21 mg at 07/29/16 1211    Lab Results:  No results found for this or any previous visit (from the past 48 hour(s)).  Blood Alcohol level:  No results found for: [facility]  Metabolic Disorder Labs: Lab Results  Component Value Date   HGBA1C 4.9 07/29/2016   MPG 94 07/29/2016   No results found for: PROLACTIN Lab Results  Component Value Date   CHOL 132 07/29/2016   TRIG 60 07/29/2016   HDL 45 07/29/2016   CHOLHDL 2.9 07/29/2016   VLDL 12 07/29/2016   LDLCALC 75 07/29/2016    Physical Findings: AIMS:  , ,  ,  ,    CIWA:    COWS:     Musculoskeletal: Strength & Muscle Tone: within normal limits Gait & Station: normal Patient leans: N/A  Psychiatric Specialty Exam: Physical Exam  Nursing note and vitals reviewed. Constitutional: She is oriented to person, place, and time. She appears well-developed  and well-nourished.  HENT:  Head: Normocephalic and atraumatic.  Eyes: EOM are normal.  Neck: Normal range of motion.  Respiratory: Effort normal.  Musculoskeletal: Normal range of motion.  Neurological: She is alert and oriented to person, place, and time.    Review of Systems  Constitutional: Positive for malaise/fatigue.  HENT: Negative.   Eyes: Negative.   Respiratory: Negative.   Cardiovascular: Negative.   Gastrointestinal: Negative.   Genitourinary: Negative.   Musculoskeletal: Positive for myalgias.  Skin: Negative.   Neurological: Positive for weakness.  Endo/Heme/Allergies: Negative.   Psychiatric/Behavioral: Positive for depression and substance abuse. Negative for hallucinations, memory loss and suicidal ideas. The patient is not nervous/anxious and does not have insomnia.     Blood pressure (!) 109/55, pulse 63, temperature 99.2 F (  37.3 C), temperature source Oral, resp. rate 18, height 5\' 5"  (1.651 m), weight 179 lb (81.2 kg), last menstrual period 07/20/2016, SpO2 93 %.Body mass index is 29.79 kg/m.  General Appearance: Casual   Eye Contact:  Fair   Speech:  Normal   Volume:  Decreased  Mood:  Dysphoric  Affect:  Blunt and tearful   Thought Process:  Linear and Descriptions of Associations: Intact  Orientation:  Full (Time, Place, and Person)  Thought Content:  Hallucinations: None  Suicidal Thoughts:  No  Homicidal Thoughts:  No  Memory:  Normal   Judgement:  Improving   Insight:  Seems limited in terms of her medication knowledge for her depression   Psychomotor Activity:  Normal   Concentration:  Concentration: Fair and Attention Span: Fair  Recall:  [employer] of Knowledge:  Good  Language:  Good  Akathisia:  No  Handed:    AIMS (if indicated):     Assets:  Communication Skills Physical Health  ADL's:  Intact  Cognition:  WNL  Sleep:  Number of Hours: 6.45     Treatment Plan Summary:  Patient is a 46 year old married Caucasian female with  major depressive disorder. The patient was admitted to our unit after a serious overdose on Effexor.   She denies having suicidal ideation at this time.   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  MDD: Patient has serious overdose on Effexor on March 19.  Though patient insists that she does not want to be on antidepressant she continues to present with flat affect and is tearful. She will benefit from an antidepressant and we will start her on the Zoloft at 25 mg daily. We will continue to monitor her on this medication prior to discharge.  Anxiety: for now continue Ativan 0.25 mg 3 times day to target possible withdrawal from effexor  Insomnia: slept well last night, continue vistaril prn   Poor appetite: does not want ensure.  Will continue multivitamins   Tobacco use disorder: continue nicotine patch 21 mg a day  Aspiration pneumonia: cont Augmentin q 12 h for total of 7 days  Cough: continue Tessalon Perles when necessary  Mouth ulcer: Resolved  Vital signs daily  Diet regular  Precautions every 15 minute checks  Hospitalization status involuntary commitment  Labs:  thyroid panel t3 and t4 wnl, B12 lowand HIV neg.   Patient sees primary care at [facility] in [place].  Disposition Will plan to discharge patient when stabilized on her current antidepressant and having an appropriate follow-up discharge plan with outpatient providers.   Patrick North, MD 08/02/2016, 11:27 AM

## 2016-08-02 NOTE — Progress Notes (Addendum)
Pt awake and alert, up in dayroom visiting, putting together a puzzle until visiting hours over. Attended wrap up group, ate snack. Noted to be in room, reading book the rest of the evening. Pt was still awake around 2300, reporting she was having trouble falling asleep. Pt did not want nurse to phone doctor for PRN medication to help with sleep. Fell asleep shortly after. Calm, cooperative, pleasant, appears brighter/anxious. Denies SI/HI/AVH, pain. Medication compliant. Support and encouragement provided. Medications administered with education. Safety maintained with every 15 minute checks. Will continue to monitor.

## 2016-08-02 NOTE — Progress Notes (Signed)
Recreation Therapy Notes   Date: 03.26.18 Time: 1:00 pm Location: Craft Room  Group Topic: Wellness  Goal Area(s) Addresses:  Patient will identify at least one item per dimension of health. Patient will examine areas they are deficient in.  Behavioral Response: Attentive, Interactive  Intervention: 6 Dimensions of Health  Activity: Patients were given a definition sheet defining the 6 Dimensions of Health. Patients were given a worksheet with each dimension listed and were instructed to write items they were currently doing in each dimension.  Education: LRT educated patients on ways they can improve each dimension.  Education Outcome: Acknowledges education/In group clarification offered  Clinical Observations/Feedback: Patient wrote items in all but one dimension. Patient contributed to group discussion by stating areas she was giving enough attention to, areas she was not giving enough attention to, ways she can improve certain dimensions, and what would change for her if she was more aware of her wellness.  Jacquelynn CreeGreene,Jernee Murtaugh M, LRT/CTRS 08/02/2016 1:55 PM

## 2016-08-03 MED ORDER — AMOXICILLIN-POT CLAVULANATE 875-125 MG PO TABS: 1.0000 | ORAL_TABLET | Freq: Two times a day (BID) | ORAL | 0 refills | Status: DC

## 2016-08-03 NOTE — [facility] Group Notes (Signed)
[facility] Group Notes:  (Nursing/MHT/Case Management/Adjunct)  Date:  08/03/2016  Time:  2:00 AM  Type of Therapy:  Psychoeducational Skills  Participation Level:  Active  Participation Quality:  Appropriate  Affect:  Appropriate  Cognitive:  Appropriate  Insight:  Appropriate  Engagement in Group:  Engaged  Modes of Intervention:  Activity  Summary of Progress/Problems:  Shelby Odonnell 08/03/2016, 2:00 AM

## 2016-08-03 NOTE — Discharge Summary (Signed)
Physician Discharge Summary Note  Patient:  Shelby Odonnell is an 46 y.o., female MRN:  [id number] DOB:  [birth year] Patient phone:  [phone] (home)  Patient address:   [address] [place] [address],  Total Time spent with patient: 30 minutes  Date of Admission:  07/28/2016 Date of Discharge: 08/03/2016  Reason for Admission:  Patient is a 46 year old married Caucasian female from [place]. Patient presented to our emergency department via EMS on March 19 after a suicidal attempt by overdose on Effexor. Per EMS, pt was locked in a room for about half an hour and they had to break the door down to get to her. She took several handfuls of 150 mg extended release Effexor.  Principal Problem: Severe recurrent major depression without psychotic features [facility]) Discharge Diagnoses: Patient Active Problem List   Diagnosis Date Noted  . Tobacco use disorder [F17.200] 07/29/2016  . Alcohol use disorder, mild, abuse [F10.10] 07/29/2016  . Severe recurrent major depression without psychotic features (HCC) [F33.2] 07/27/2016  . Overdose of antidepressant, intentional self-harm, initial encounter [facility]) [T43.202A] 07/27/2016    Past Psychiatric History: denies previous psychiatric hospitalizations or suicide attempts. Has never seen a psychiatrist or therapist.  Past Medical History: History reviewed. No pertinent past medical history.  Past Surgical History:  Procedure Laterality Date  . CESAREAN SECTION     Family History:  Family History  Problem Relation Age of Onset  . Colon cancer Father    Family Psychiatric  History:  Social History:  History  Alcohol Use  . Yes     History  Drug Use No    Social History   Social History  . Marital status: Married    Spouse name: N/A  . Number of children: N/A  . Years of education: N/A   Social History Main Topics  . Smoking status: Current Every Day Smoker    Packs/day: 1.00    Types: Cigarettes  . Smokeless tobacco:  Never Used  . Alcohol use Yes  . Drug use: No  . Sexual activity: Not Asked   Other Topics Concern  . None   Social History Narrative  . None    Hospital Course:  Patient is a 46 year old Caucasian woman who was admitted to the behavioral health unit after she had overdosed on Effexor. She was first stabilized on the medical unit after she had 3 seizures and aspiration pneumonia. She was then admitted to the behavioral health unit with all precautions and introduced to the therapeutic milieu. She was started antibiotics for her aspiration pneumonia and cough suppressants to help with cough associated with her overdose. Patient reported to the attending physician that she was glad to be alive and denied having any suicidal ideations from the beginning of her hospitalization. She reported that she was having some marital issues since her husband had been having an affair. She reported that he is making changes towards this. Patient was started on Ativan to help with her anxiety and the Vistaril as needed for her sleep. She was not started on any antidepressants given that she had a serious overdose on the Effexor. She was also started on a nicotine patch to help with the withdrawal symptoms from her cigarette smoking. Patient was cooperative on the unit and attended all treatment modalities. By Monday she was stable and requested to be discharged. This clinician saw her for the first time on Monday and discussed starting her on an antidepressant to help with her mood symptoms.  However patient stated that she was not interested in taking any antidepressants. She stated that she was started on Effexor because she had been crying a lot because of her premenopausal symptoms. This clinician attempted to educate patient that this could have been serious depression which could have her to her crying. Patient appeared to understand this but stated that she would not want to take any medication for depression at  this time. She was agreeable to follow up with the therapist and a psychiatrist.  By the day of her discharge which is 08/03/2016 patient was quite stable with good sleep and appetite. She has not had any behavioral disturbances on the unit. She did not make any attempts to hurt herself. Staff observed that her husband would visit her every day and had good visits. Patient also revealed to this clinician that they're planning on marriage counseling and that down the stressors that led her to a suicide attempt have been taken care of. Patient denied any suicidal thoughts and was compliant with the therapy on an outpatient basis and to follow-up with a psychiatrist.  Patient was extensively discussed with staff including social worker Earl Lites and her nurse and was deemed to be stable and safe for discharge.  Physical Findings: AIMS:  , ,  ,  ,    CIWA:    COWS:     Musculoskeletal: Strength & Muscle Tone: within normal limits Gait & Station: normal Patient leans: N/A  Psychiatric Specialty Exam: Physical Exam  ROS  Blood pressure (!) 109/44, pulse 72, temperature 98.8 F (37.1 C), temperature source Oral, resp. rate 18, height 5\' 5"  (1.651 m), weight 179 lb (81.2 kg), last menstrual period 07/20/2016, SpO2 100 %.Body mass index is 29.79 kg/m.  General Appearance: Casual  Eye Contact:  Fair  Speech:  Clear and Coherent  Volume:  Normal  Mood:  Euthymic  Affect:  Appropriate  Thought Process:  Coherent  Orientation:  Full (Time, Place, and Person)  Thought Content:  Logical  Suicidal Thoughts:  No  Homicidal Thoughts:  No  Memory:  Immediate;   Fair Recent;   Fair Remote;   Fair  Judgement:  Fair  Insight:  Shallow  Psychomotor Activity:  Normal  Concentration:  Concentration: Fair and Attention Span: Fair  Recall:  [employer] of Knowledge:  Fair  Language:  Fair  Akathisia:  No  Handed:  Right  AIMS (if indicated):     Assets:  Communication Skills Desire for  Improvement Financial Resources/Insurance Housing Physical Health Resilience Vocational/Educational  ADL's:  Intact  Cognition:  WNL  Sleep:  Number of Hours: 6.3     Have you used any form of tobacco in the last 30 days? (Cigarettes, Smokeless Tobacco, Cigars, and/or Pipes): Yes  Has this patient used any form of tobacco in the last 30 days? (Cigarettes, Smokeless Tobacco, Cigars, and/or Pipes) Yes, Yes, A prescription for an FDA-approved tobacco cessation medication was offered at discharge and the patient refused  Blood Alcohol level:  No results found for: [facility]  Metabolic Disorder Labs:  Lab Results  Component Value Date   HGBA1C 4.9 07/29/2016   MPG 94 07/29/2016   No results found for: PROLACTIN Lab Results  Component Value Date   CHOL 132 07/29/2016   TRIG 60 07/29/2016   HDL 45 07/29/2016   CHOLHDL 2.9 07/29/2016   VLDL 12 07/29/2016   LDLCALC 75 07/29/2016    See Psychiatric Specialty Exam and Suicide Risk Assessment completed by Attending Physician  prior to discharge.  Discharge destination:  Home  Is patient on multiple antipsychotic therapies at discharge:  No   Has Patient had three or more failed trials of antipsychotic monotherapy by history:  No  Recommended Plan for Multiple Antipsychotic Therapies: NA  Discharge Instructions    Diet - low sodium heart healthy    Complete by:  As directed    Increase activity slowly    Complete by:  As directed      Allergies as of 08/03/2016      Reactions   Fluoxetine Other (See Comments)   Other Reaction: OTHER REACTION, onSet: 09/03/2009   Tetracyclines & Related Rash      Medication List    STOP taking these medications   ascorbic acid 250 MG tablet Commonly known as:  VITAMIN C   benzonatate 200 MG capsule Commonly known as:  TESSALON   guaiFENesin 100 MG/5ML Soln Commonly known as:  ROBITUSSIN     TAKE these medications     Indication  albuterol 108 (90 Base) MCG/ACT inhaler Commonly  known as:  PROVENTIL HFA;VENTOLIN HFA Inhale 2 puffs into the lungs daily as needed.  Indication:  Asthma   amoxicillin-clavulanate 875-125 MG tablet Commonly known as:  AUGMENTIN Take 1 tablet by mouth every 12 (twelve) hours.  Indication:  Pneumonia   FISH OIL PO Take 1 capsule by mouth daily.  Indication:  health supplement   mometasone-formoterol 100-5 MCG/ACT Aero Commonly known as:  DULERA Inhale 2 puffs into the lungs 2 (two) times daily.  Indication:  Asthma   simvastatin 20 MG tablet Commonly known as:  ZOCOR Take 20 mg by mouth daily.  Indication:  High Amount of Fats in the Blood      Follow-up Information    [facility] Follow up on 08/24/2016.   Why:  Follow-up appointment with Dr. Percell Belt for medication management at 9am. Bring photo I.D., list of medications, and discharge summary to this appointment. Arrive 15 minutes early. Resechedule or cancel 24hrs before appointment to avoid no show policy.  Contact information: [address] [place] [address] [phone]        [facility]. Go on 08/09/2016.   Specialty:  Behavioral Health Why:  Please attend your follow up therapy appointment with Nolon Rod on 08/09/16 at 9:30am.  Please bring your insurance card and a copy of your hospital discharge paperwork. Contact information: [address] Medical [facility] [place] [address] [phone]          Follow-up recommendations:  Activity:  normal Diet:  low fat  Comments:  Patient will follow-up with the above appointments. Patient refused to take any antidepressants during her stay here. She stated that her symptoms were from a situation that arose because of her husband's behaviors. She stated that her husband was making things better for her. She however agreed to follow-up with a psychiatrist and a therapist and will consider taking medications at a future date. She  does not appear to be at any risk for self-harm at this time. This clinician also spoke to the husband who called yesterday and insisted on patient being discharged on Monday. This clinician educated the husband that the patient will need to have appropriate outpatient follow-up appointments and observed to make sure that she is not at risk to herself. Patient`s husband stated that she was quite stable and he felt like she was ready to return home. We discussed that she would be discharged  on Tuesday. Patient was agreeable to this plan.  SignedPatrick North: Turhan Chill, MD 08/03/2016, 9:56 AM

## 2016-08-03 NOTE — Plan of Care (Signed)
Problem: Activity: Goal: Sleeping patterns will improve Outcome: Progressing Patient slept for Estimated Hours of 6.30; every 15 minutes safety round maintained, no injury or falls during this shift.    

## 2016-08-03 NOTE — Progress Notes (Signed)
D: Patient is aware of  Discharge this shift .Patient denies suicidal /homicidal ideations. Patient received all belongings brought in  A: No Storage medications. Writer reviewed Discharge Summary, Suicide Risk Assessment, and Transitional Record.  A: Writer instructed on discharge criteria  . Informed of Of medication   and prescriptions  given to patient  . Aware  Of follow up appointment . R: Patient left unit with no questions  Or concerns  With husband

## 2016-08-03 NOTE — Progress Notes (Signed)
  [facility] Adult Case Management Discharge Plan :  Will you be returning to the same living situation after discharge:  Yes,  with husband At discharge, do you have transportation home?: Yes,  husband Do you have the ability to pay for your medications: Yes,  [employer]  Release of information consent forms completed and in the chart;  Patient's signature needed at discharge.  Patient to Follow up at: Follow-up Information    [facility] Follow up on 08/24/2016.   Why:  Follow-up appointment with Dr. Percell BeltMillet for medication management at 9am. Bring photo I.D., list of medications, and discharge summary to this appointment. Arrive 15 minutes early. Resechedule or cancel 24hrs before appointment to avoid no show policy.  Contact information: [address] [place] [address] [phone]        [facility]. Go on 08/09/2016.   Specialty:  Behavioral Health Why:  Please attend your follow up therapy appointment with Nolon RodNicole Peacock on 08/09/16 at 9:30am.  Please bring your insurance card and a copy of your hospital discharge paperwork. Contact information: [address] [facility] [place] [id number] [phone]          Next level of care provider has access to [facility] Link:yes, [employer], No [facility]  Safety Planning and Suicide Prevention discussed: Yes,  with husband  Have you used any form of tobacco in the last 30 days? (Cigarettes, Smokeless Tobacco, Cigars, and/or Pipes): Yes  Has patient been referred to the Quitline?: Patient refused referral  Patient has been referred for addiction treatment: Yes  Lorri FrederickWierda, Brighten Orndoff Jon, LCSW 08/03/2016, 8:16 AM

## 2016-08-03 NOTE — [facility] Suicide Risk Assessment (Signed)
[facility]   Principal Problem: Severe recurrent major depression without psychotic features [facility](HCC) Discharge Diagnoses:  Patient Active Problem List   Diagnosis Date Noted  . Tobacco use disorder [F17.200] 07/29/2016  . Alcohol use disorder, mild, abuse [F10.10] 07/29/2016  . Severe recurrent major depression without psychotic features (HCC) [F33.2] 07/27/2016  . Overdose of antidepressant, intentional self-harm, initial encounter [facility](HCC) [T43.202A] 07/27/2016    Total Time spent with patient: 30 minutes  Musculoskeletal: Strength & Muscle Tone: within normal limits Gait & Station: normal Patient leans: N/A  Psychiatric Specialty Exam: ROS  Blood pressure (!) 109/44, pulse 72, temperature 98.8 F (37.1 C), temperature source Oral, resp. rate 18, height 5\' 5"  (1.651 m), weight 179 lb (81.2 kg), last menstrual period 07/20/2016, SpO2 100 %.Body mass index is 29.79 kg/m.  General Appearance: Casual  Eye Contact::  Fair  Speech:  Clear and Coherent409  Volume:  Normal  Mood:  Euthymic  Affect:  Appropriate  Thought Process:  Coherent  Orientation:  Full (Time, Place, and Person)  Thought Content:  Logical  Suicidal Thoughts:  No  Homicidal Thoughts:  No  Memory:  Immediate;   Fair Recent;   Fair Remote;   Fair  Judgement:  Fair  Insight:  Shallow  Psychomotor Activity:  Normal  Concentration:  Fair  Recall:  [employer]  Fund of Knowledge:Fair  Language: Fair  Akathisia:  No  Handed:  Right  AIMS (if indicated):     Assets:  Communication Skills Desire for Improvement Financial Resources/Insurance Housing Physical Health Social Support Vocational/Educational  Sleep:  Number of Hours: 6.3  Cognition: WNL  ADL's:  Intact   Mental Status Per Nursing Assessment::   On Admission:     Demographic Factors:  Patient is a 46 yo married, employed caucasian woman.  Loss Factors: Marital issues  Historical Factors: Family history of mental  illness or substance abuse  Risk Reduction Factors:   Sense of responsibility to family, Employed, Living with another person, especially a relative, Positive therapeutic relationship and Positive coping skills or problem solving skills  Continued Clinical Symptoms:  Improved mood.  Cognitive Features That Contribute To Risk:  None    Suicide Risk:  Minimal: No identifiable suicidal ideation.  Patients presenting with no risk factors but with morbid ruminations; may be classified as minimal risk based on the severity of the depressive symptoms  Follow-up Information    [facility] Follow up on 08/24/2016.   Why:  Follow-up appointment with Dr. Percell BeltMillet for medication management at 9am. Bring photo I.D., list of medications, and discharge summary to this appointment. Arrive 15 minutes early. Resechedule or cancel 24hrs before appointment to avoid no show policy.  Contact information: [address] [place] [address] [phone]        [facility]. Go on 08/09/2016.   Specialty:  Behavioral Health Why:  Please attend your follow up therapy appointment with Nolon RodNicole Peacock on 08/09/16 at 9:30am.  Please bring your insurance card and a copy of your hospital discharge paperwork. Contact information: [address] [facility] [place] [id number] [phone]          Plan Of Care/Follow-up recommendations:  Activity:  normal Diet:  fat  Lenox Ladouceur, MD 08/03/2016, 10:01 AM

## 2016-08-03 NOTE — [facility] Group Notes (Signed)
[facility] LCSW Group Therapy Note  Date/Time: 08/03/16, 0930  Type of Therapy/Topic:  Group Therapy:  Feelings about Diagnosis  Participation Level:  None   Mood:   Description of Group:    This group will allow patients to explore their thoughts and feelings about diagnoses they have received. Patients will be guided to explore their level of understanding and acceptance of these diagnoses. Facilitator will encourage patients to process their thoughts and feelings about the reactions of others to their diagnosis, and will guide patients in identifying ways to discuss their diagnosis with significant others in their lives. This group will be process-oriented, with patients participating in exploration of their own experiences as well as giving and receiving support and challenge from other group members.   Therapeutic Goals: 1. Patient will demonstrate understanding of diagnosis as evidence by identifying two or more symptoms of the disorder:  2. Patient will be able to express two feelings regarding the diagnosis 3. Patient will demonstrate ability to communicate their needs through discussion and/or role plays  Summary of Patient Progress: Pt came to group but was immediately called out by Dr Daleen Boavi.        Therapeutic Modalities:   Cognitive Behavioral Therapy Brief Therapy Feelings Identification   Daleen SquibbGreg Kamala Kolton, LCSW

## 2016-08-09 ENCOUNTER — Ambulatory Visit: Payer: 59 | Admitting: Licensed Clinical Social Worker

## 2016-08-09 DIAGNOSIS — F1010 Alcohol abuse, uncomplicated: Secondary | ICD-10-CM

## 2016-08-09 DIAGNOSIS — F332 Major depressive disorder, recurrent severe without psychotic features: Secondary | ICD-10-CM
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
## 2016-08-16 ENCOUNTER — Ambulatory Visit (INDEPENDENT_AMBULATORY_CARE_PROVIDER_SITE_OTHER): Payer: 59 | Admitting: Licensed Clinical Social Worker

## 2016-08-16 DIAGNOSIS — F332 Major depressive disorder, recurrent severe without psychotic features: Secondary | ICD-10-CM

## 2016-08-17 ENCOUNTER — Telehealth: Payer: Self-pay

## 2016-08-17 NOTE — Telephone Encounter (Signed)
pt called left message that she needs a letter to return to work.  pt seen you on  08-10-15 and on  08-17-15.  Pt does not have a follow up appt.

## 2016-08-18 ENCOUNTER — Telehealth: Payer: Self-pay | Admitting: Licensed Clinical Social Worker

## 2016-08-18 ENCOUNTER — Telehealth: Payer: Self-pay | Admitting: Psychiatry

## 2016-08-19 NOTE — Telephone Encounter (Signed)
Spoke to [employer] about her request.  She was referred to [facility].

## 2016-08-19 NOTE — Telephone Encounter (Signed)
I spoke with her.  Informed her that I am unable to "return to work."  I spoke to [employer]. About her request. She has been referred to [facility] IOP in [place].

## 2016-08-19 NOTE — Telephone Encounter (Signed)
D:  Pt was referred to [facility] by Nolon Rod, LCSW (under directions of manager Minerva Areola).  A:  Placed call to orient pt.  Pt states she is just seeking a note to return to work.  Denies SI/HI or A/V hallucinations.  Declined group.  States her FMLA was approved, but not short term disability.  "I have to get back to work, because I have no money coming in."  Recommend pt to contact her case manager from [employer], and if that doesn't work contact PCP to try to obtain a RTW note.  R:  Pt receptive.  Jeri Modena, M.Ed, CNA

## 2016-08-23 NOTE — Progress Notes (Signed)
   THERAPIST PROGRESS NOTE  Session Time:  Participation Level: Active  Behavioral Response: CasualAlertEuthymic  Type of Therapy: Individual Therapy  Treatment Goals addressed: Coping  Interventions: CBT, Solution Focused and Supportive  Summary: Shelby Odonnell is a 46 y.o. female who presents with continued symptoms of her diagnosis.  Patient was able to list goals for herself and discussed why she wants to work on that particular thing. Discussion on what she wants to get out of therapy.  LCSW discussed what psychotherapy is and is not and the importance of the therapeutic relationship to include open and honest communication between client and therapist and building trust.  Reviewed advantages and disadvantages of the therapeutic process and limitations to the therapeutic relationship including LCSW's role in maintaining the safety of the client, others and those in client's care.    Suicidal/Homicidal: No  Therapist Response:  Assessed pt current functioning per pt report.  Focused on rapport building w/ pt and exploring w/ pt her tx hx and what she is looking for in counseling currently.  Discussed current symptoms and focused on pt utilizing her strengths w/ typical struggles.  Developed tx plan w/ pt.    Plan: Return again in 2 weeks.  Diagnosis: Axis I: Depression    Axis II: No diagnosis    Marinda Elk, LCSW 08/19/2016

## 2016-08-24 ENCOUNTER — Ambulatory Visit: Payer: 59 | Admitting: Licensed Clinical Social Worker

## 2016-09-14 ENCOUNTER — Ambulatory Visit (INDEPENDENT_AMBULATORY_CARE_PROVIDER_SITE_OTHER): Payer: 59 | Admitting: Licensed Clinical Social Worker

## 2016-09-14 DIAGNOSIS — F332 Major depressive disorder, recurrent severe without psychotic features: Secondary | ICD-10-CM

## 2016-09-15 NOTE — Progress Notes (Signed)
   THERAPIST PROGRESS NOTE  Session Time: 30min  Participation Level: Active  Behavioral Response: CasualAlertEuthymic  Type of Therapy: Individual Therapy  Treatment Goals addressed: Coping  Interventions: CBT, Solution Focused and Supportive  Summary: Shelby DossJudy G Steffensmeier is a 46 y.o. female who presents with continued symptoms of her diagnosis.  Patient reports a favorable mood.  She apologized for being a hour late.  She states that she recorded the correct time but misread it.  Patient reports that she does not have poor moods anymore.  She reports that she has placed expectations and boundaries with her husband.  She reports that she has discussed with him marriage counseling but has yet to get a response from him.  Discussion on "him breaking expectation & boundaries."  She reports that she is thinking about leaving him if he does not maintain boundaries.  Suicidal/Homicidal: No  Therapist Response:  LCSW provided Patient with ongoing emotional support and encouragement.  Normalized her feelings.  Commended Patient on her progress and reinforced the importance of client staying focused on her own strengths and resources and resiliency. Processed various strategies for dealing with stressors.    Plan: Return again in 2 weeks.  Diagnosis: Axis I: Depression    Axis II: No diagnosis    Marinda Elkicole M Kayde Atkerson, LCSW 09/15/2016

## 2016-09-16 ENCOUNTER — Encounter: Payer: Self-pay | Admitting: Obstetrics and Gynecology

## 2016-09-21 ENCOUNTER — Ambulatory Visit (INDEPENDENT_AMBULATORY_CARE_PROVIDER_SITE_OTHER): Payer: 59 | Admitting: Obstetrics and Gynecology

## 2016-09-21 ENCOUNTER — Encounter: Payer: Self-pay | Admitting: Obstetrics and Gynecology

## 2016-09-21 VITALS — BP 122/68 | HR 90 | Ht 63.0 in | Wt 171.0 lb

## 2016-09-21 DIAGNOSIS — Z1151 Encounter for screening for human papillomavirus (HPV): Secondary | ICD-10-CM | POA: Diagnosis not present

## 2016-09-21 DIAGNOSIS — Z01419 Encounter for gynecological examination (general) (routine) without abnormal findings: Secondary | ICD-10-CM

## 2016-09-21 DIAGNOSIS — Z1231 Encounter for screening mammogram for malignant neoplasm of breast: Secondary | ICD-10-CM | POA: Diagnosis not present

## 2016-09-21 DIAGNOSIS — Z124 Encounter for screening for malignant neoplasm of cervix: Secondary | ICD-10-CM | POA: Diagnosis not present

## 2016-09-21 DIAGNOSIS — N83209 Unspecified ovarian cyst, unspecified side: Secondary | ICD-10-CM

## 2016-09-21 DIAGNOSIS — Z1239 Encounter for other screening for malignant neoplasm of breast: Secondary | ICD-10-CM

## 2016-09-21 DIAGNOSIS — R1032 Left lower quadrant pain: Secondary | ICD-10-CM

## 2016-09-21 NOTE — Progress Notes (Signed)
Chief Complaint  Patient presents with  . Gynecologic Exam    Discuss possible surgery     HPI:      Ms. Shelby Odonnell is a 46 y.o. [id number] who LMP was Patient's last menstrual period was 08/28/2016 (exact date)., presents today for her NP annual examination.  Her menses are regular every 28-30 days, lasting 3-10 days, light flow  Dysmenorrhea mild, occurring first 1-2 days of flow. She does not have intermenstrual bleeding. She is s/p endometrial ablation years ago for menorrhagia. She has a hx of ovar cysts in the past. She was on OCPs as a preventive, but stopped them about 4-5 months ago due to smoking. She has had a couple episodes of LLQ, sharp, fleeting pains, lasting 5-10 min since off OCPs. She is interested in partial hyst (uterus only) to stop ovarian cysts and occas pain. She does not want ovaries removed.  Sex activity: single partner, contraception - vasectomy.  Last Pap: years ago.  Last mammogram: unknown There is no FH of breast cancer. There is a poss FH of ovarian cancer in her MGM. The patient does not do self-breast exams.  Tobacco use: 1 ppd; had stopped smoking but resumed about 15 months ago Alcohol use: none Exercise: not active  She does not get adequate calcium and Vitamin D in her diet.  She has SUI sx with sneeze, cough. She drinks 3 large cups coffee every morning. She has tried kegel exercises.  Past Medical History:  Diagnosis Date  . Abnormal mammogram 2010  . Depression   . Hyperlipidemia     Past Surgical History:  Procedure Laterality Date  . CESAREAN SECTION    . COLONOSCOPY  2009    Family History  Problem Relation Age of Onset  . Colon cancer Father   . Bladder Cancer Maternal Grandmother   . Ovarian cancer Maternal Grandmother     Social History   Social History  . Marital status: Married    Spouse name: N/A  . Number of children: N/A  . Years of education: N/A   Occupational History  . Not on file.   Social History  Main Topics  . Smoking status: Current Every Day Smoker    Packs/day: 1.00    Types: Cigarettes  . Smokeless tobacco: Never Used  . Alcohol use Yes  . Drug use: No  . Sexual activity: Yes    Birth control/ protection: None   Other Topics Concern  . Not on file   Social History Narrative  . No narrative on file     Current Outpatient Prescriptions:  .  DULoxetine (CYMBALTA) 30 MG capsule, Take 30 mg by mouth daily., Disp: , Rfl:  .  simvastatin (ZOCOR) 20 MG tablet, Take 20 mg by mouth daily., Disp: , Rfl:  .  venlafaxine XR (EFFEXOR-XR) 150 MG 24 hr capsule, Take 150 mg by mouth daily with breakfast., Disp: , Rfl:   ROS:  Review of Systems  Constitutional: Negative for fatigue, fever and unexpected weight change.  Respiratory: Positive for cough. Negative for shortness of breath and wheezing.   Cardiovascular: Negative for chest pain, palpitations and leg swelling.  Gastrointestinal: Negative for blood in stool, constipation, diarrhea, nausea and vomiting.  Endocrine: Negative for cold intolerance, heat intolerance and polyuria.  Genitourinary: Positive for vaginal discharge. Negative for dyspareunia, dysuria, flank pain, frequency, genital sores, hematuria, menstrual problem, pelvic pain, urgency, vaginal bleeding and vaginal pain.  Musculoskeletal: Negative for back pain, joint swelling and myalgias.  Skin: Negative for rash.  Neurological: Negative for dizziness, syncope, light-headedness, numbness and headaches.  Hematological: Negative for adenopathy.  Psychiatric/Behavioral: Negative for agitation, confusion, sleep disturbance and suicidal ideas. The patient is not nervous/anxious.      Objective: BP 122/68 (BP Location: Left Arm, Patient Position: Sitting, Cuff Size: Normal)   Pulse 90   Ht 5\' 3"  (1.6 m)   Wt 171 lb (77.6 kg)   LMP 08/28/2016 (Exact Date)   BMI 30.29 kg/m    Physical Exam  Constitutional: She is oriented to person, place, and time. She  appears well-developed and well-nourished.  Genitourinary: Vagina normal and uterus normal. There is no rash or tenderness on the right labia. There is no rash or tenderness on the left labia. No erythema or tenderness in the vagina. No vaginal discharge found. Right adnexum does not display mass and does not display tenderness. Left adnexum does not display mass and does not display tenderness. Cervix does not exhibit motion tenderness or polyp. Uterus is not enlarged or tender.  Neck: Normal range of motion. No thyromegaly present.  Cardiovascular: Normal rate, regular rhythm and normal heart sounds.   No murmur heard. Pulmonary/Chest: Effort normal and breath sounds normal. Right breast exhibits no mass, no nipple discharge, no skin change and no tenderness. Left breast exhibits no mass, no nipple discharge, no skin change and no tenderness.  Abdominal: Soft. There is no tenderness. There is no guarding.  Musculoskeletal: Normal range of motion.  Neurological: She is alert and oriented to person, place, and time. No cranial nerve deficit.  Psychiatric: She has a normal mood and affect. Her behavior is normal.  Vitals reviewed.   Assessment/Plan: Encounter for annual routine gynecological examination  Cervical cancer screening - Plan: IGP, Aptima HPV  Screening for HPV (human papillomavirus) - Plan: IGP, Aptima HPV  Screening for breast cancer - Pt to sched mammo. - Plan: MM DIGITAL SCREENING BILATERAL  Cyst of ovary, unspecified laterality - Discussed rem of ovaries to stop ovar cysts, not uterus. Offered depo since smoker. Since sx infrequent, suggested pt f/u with sx for u/s to confirm etiology.  LLQ pain - Occas LLQ pains. Question scar tissue vs ovar cyst. F/u with sx for u/s.              GYN counsel mammography screening, menopause, Kegel's exercises     F/U  Return in about 1 year (around 09/21/2017).  Jontae Sonier B. Claudette Wermuth, PA-C 09/21/2016 9:10 AM

## 2016-09-23 LAB — IGP, APTIMA HPV
HPV Aptima: POSITIVE — AB
PAP Smear Comment: 0

## 2016-10-14 ENCOUNTER — Ambulatory Visit (INDEPENDENT_AMBULATORY_CARE_PROVIDER_SITE_OTHER): Payer: 59 | Admitting: Licensed Clinical Social Worker

## 2016-10-14 DIAGNOSIS — F332 Major depressive disorder, recurrent severe without psychotic features: Secondary | ICD-10-CM | POA: Diagnosis not present

## 2016-10-14 NOTE — Progress Notes (Signed)
   THERAPIST PROGRESS NOTE  Session Time: 60min  Participation Level: Active  Behavioral Response: CasualAlertEuthymic  Type of Therapy: Individual Therapy  Treatment Goals addressed: Coping  Interventions: CBT, Solution Focused and Supportive  Summary: Shelby DossJudy G Kister is a 46 y.o. female who presents with continued symptoms of her diagnosis.  Patient reports a favorable mood.  She reports feeling like things are progressing.  She reports that she is currently on thin ice at work due to denial of FMLA.  Explored with her current symptoms and her marital status.  Patient reports using a fitbit like product to track her sleep.Patient reports that she has a Girlfriend.  Patient reports that she wants to work on becoming self aware and to do things are her to make her happy.  Patient to use journal to explore thoughts and feelings. Patient to create boundaries for herself and implement them in her life.  Suicidal/Homicidal: No  Therapist Response:  LCSW provided Patient with ongoing emotional support and encouragement.  Normalized her feelings.  Commended Patient on her progress and reinforced the importance of client staying focused on her own strengths and resources and resiliency. Processed various strategies for dealing with stressors.    Plan: Return again in 2 weeks.  Diagnosis: Axis I: Depression    Axis II: No diagnosis    Marinda Elkicole M Kariann Wecker, LCSW 10/14/2016

## 2016-11-04 ENCOUNTER — Ambulatory Visit (INDEPENDENT_AMBULATORY_CARE_PROVIDER_SITE_OTHER): Payer: 59 | Admitting: Licensed Clinical Social Worker

## 2016-11-04 DIAGNOSIS — F332 Major depressive disorder, recurrent severe without psychotic features: Secondary | ICD-10-CM | POA: Diagnosis not present

## 2016-11-11 NOTE — Progress Notes (Signed)
   THERAPIST PROGRESS NOTE  Session Time: 60min  Participation Level: Active  Behavioral Response: CasualAlertEuthymic  Type of Therapy: Individual Therapy  Treatment Goals addressed: Coping  Interventions: CBT, Solution Focused and Supportive  Summary: Shelby Odonnell is a 46 y.o. female who presents with continued symptoms of her diagnosis.  Patient reports a favorable mood.  She reports "things are normal again." She reports that her FMLA was approved so all of her "points" at work are gone.  She reports that she moved to a different location at work to assist with focusing and her ability to complete task easier.  She reports that she is ok with her marriage and how things currently are.  She reports that she is having fun with her husband and her girlfriend.  She reports that she is no longer worrying about her husband's girlfriends.  SHe was able to list her coping skills and her triggers.  She reports sorting helps her refocus.   Suicidal/Homicidal: No  Therapist Response:  LCSW provided Patient with ongoing emotional support and encouragement.  Normalized her feelings.  Commended Patient on her progress and reinforced the importance of client staying focused on her own strengths and resources and resiliency. Processed various strategies for dealing with stressors.    Plan: Return again in 2 weeks.  Diagnosis: Axis I: Depression    Axis II: No diagnosis    Shelby Elkicole M Ryun Velez, LCSW 11/08/2016

## 2017-06-01 ENCOUNTER — Ambulatory Visit
Admission: RE | Admit: 2017-06-01 | Discharge: 2017-06-01 | Disposition: A | Discharge status: Home / Self Care | Payer: Managed Care, Other (non HMO) | Source: Ambulatory Visit | Attending: Family Medicine | Admitting: Family Medicine

## 2017-06-01 ENCOUNTER — Other Ambulatory Visit: Payer: Self-pay | Admitting: Family Medicine

## 2017-06-01 DIAGNOSIS — R05 Cough: Secondary | ICD-10-CM | POA: Diagnosis present

## 2017-06-01 DIAGNOSIS — R059 Cough, unspecified: Secondary | ICD-10-CM

## 2018-03-25 IMAGING — CR DG TOE GREAT 2+V*L*
3 series · 3 of 3 positions shown · non-contrast
Comparison: None.

CLINICAL DATA: Left great toe injury.

EXAM:
LEFT GREAT TOE

[toe ap]
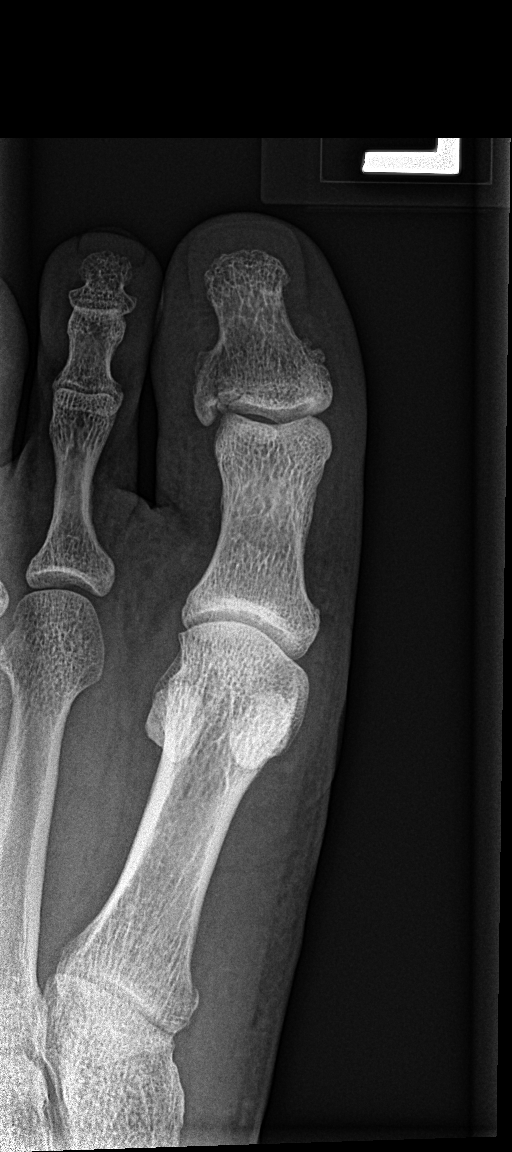

[toe obl]
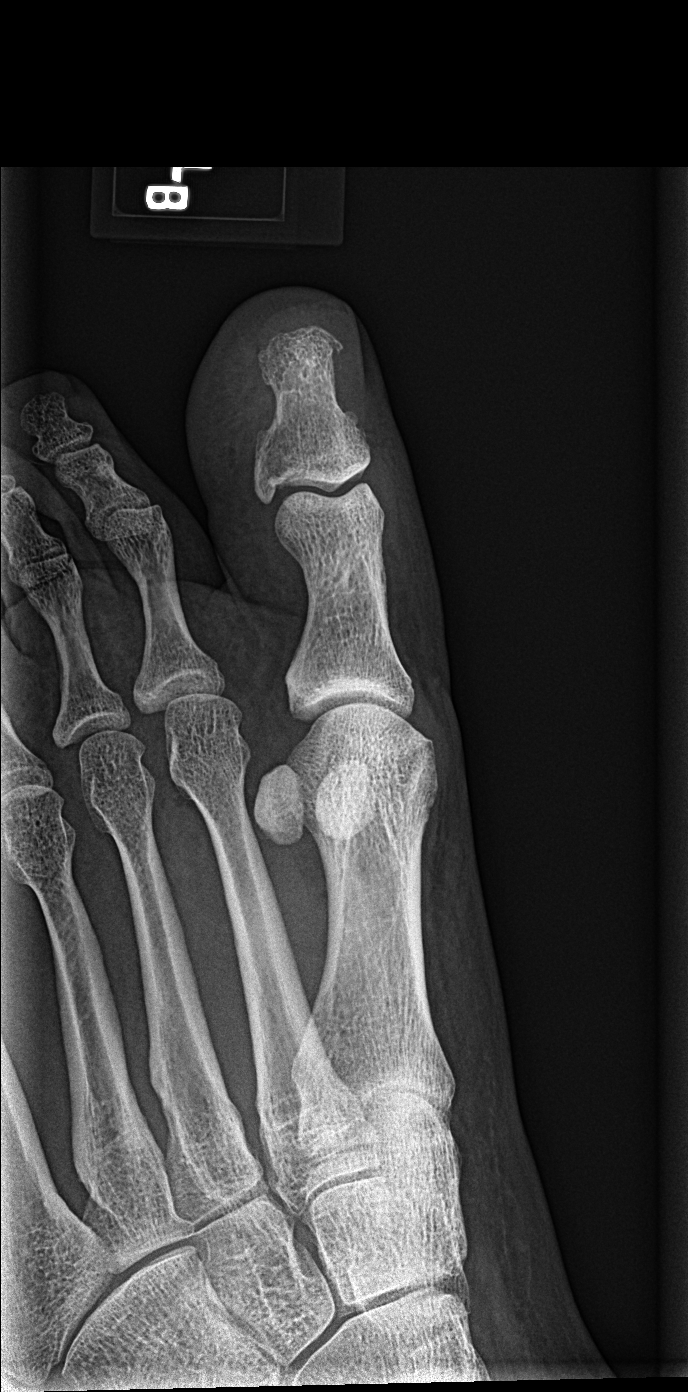

[toe lat]
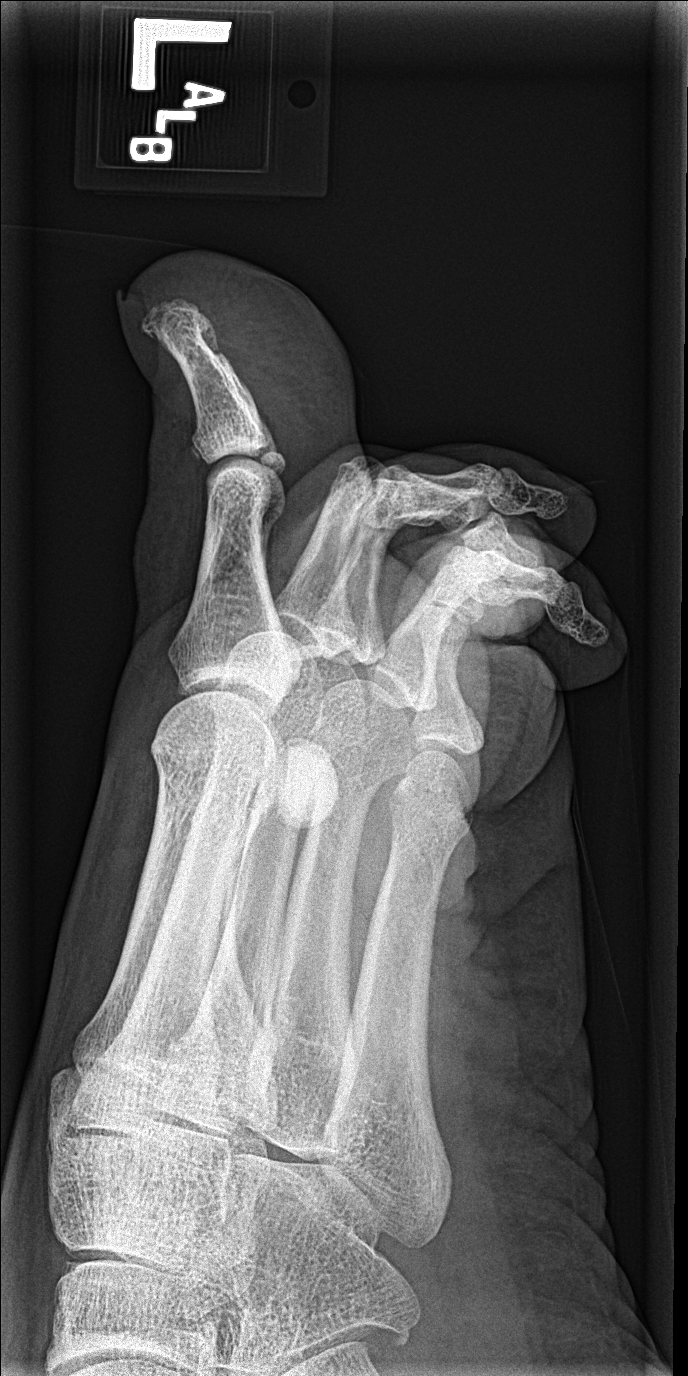

[3 of 3 positions shown; findings below may reference images not displayed]

FINDINGS: Minimally displaced fracture noted along the lateral aspect of the
base of the distal phalanx of the left great toe. Very subtle
nondisplaced fracture at the medial base of the left first
metatarsal cannot be completely excluded. Diffuse degenerative
change. No radiopaque foreign body.
IMPRESSION: 1. Minimally displaced fracture noted at the lateral base of the
distal phalanx of the left great toe.

2. Very subtle nondisplaced fracture at the medial base of the left
first metatarsal cannot be completely excluded.

## 2018-07-11 ENCOUNTER — Encounter: Payer: Self-pay | Admitting: Obstetrics and Gynecology

## 2018-07-11 ENCOUNTER — Ambulatory Visit (INDEPENDENT_AMBULATORY_CARE_PROVIDER_SITE_OTHER): Payer: Managed Care, Other (non HMO) | Admitting: Obstetrics and Gynecology

## 2018-07-11 VITALS — BP 130/80 | HR 75 | Ht 65.0 in | Wt 172.0 lb

## 2018-07-11 DIAGNOSIS — Z1239 Encounter for other screening for malignant neoplasm of breast: Secondary | ICD-10-CM

## 2018-07-11 DIAGNOSIS — Z1151 Encounter for screening for human papillomavirus (HPV): Secondary | ICD-10-CM

## 2018-07-11 DIAGNOSIS — Z8041 Family history of malignant neoplasm of ovary: Secondary | ICD-10-CM | POA: Insufficient documentation

## 2018-07-11 DIAGNOSIS — K635 Polyp of colon: Secondary | ICD-10-CM

## 2018-07-11 DIAGNOSIS — Z1211 Encounter for screening for malignant neoplasm of colon: Secondary | ICD-10-CM

## 2018-07-11 DIAGNOSIS — Z01419 Encounter for gynecological examination (general) (routine) without abnormal findings: Secondary | ICD-10-CM | POA: Diagnosis not present

## 2018-07-11 DIAGNOSIS — Z124 Encounter for screening for malignant neoplasm of cervix: Secondary | ICD-10-CM

## 2018-07-11 DIAGNOSIS — Z113 Encounter for screening for infections with a predominantly sexual mode of transmission: Secondary | ICD-10-CM

## 2018-07-11 NOTE — Patient Instructions (Addendum)
I value your feedback and entrusting us with your care. If you get a [facility] patient survey, I would appreciate you taking the time to let us know about your experience today. Thank you!  [facility]: [phone]    

## 2018-07-11 NOTE — Progress Notes (Signed)
Chief Complaint  Patient presents with  . Gynecologic Exam    follow up on the HPV, wants STD screening  . [employer] Employee     HPI:      Ms. Shelby Odonnell is a 48 y.o. [id number] who LMP was Patient's last menstrual period was 06/18/2018 (exact date)., presents today for her annual examination.  Her menses are regular every Q1-3 months due to perimenopause, lasting 3-4 days, light flow  Dysmenorrhea none.  She does not have intermenstrual bleeding. She is s/p endometrial ablation years ago for menorrhagia. She has a hx of ovar cysts in the past.   Sex activity: single partner, contraception - none. Declines BC.   Last Pap: 09/21/16 Results: no abnormalities/POS HPV DNA. Repeat pap due.  Last mammogram: 11/27/08 Results: were normal, repeat in 12 months.  There is no FH of breast cancer. There is a poss FH of ovarian vs uterine cancer in her MGM, genetic testing not done. The patient does do self-breast exams.  Tobacco use: 1 ppd; had stopped smoking but resumed  Alcohol use: social Drugs: Marijuana daily Exercise: mod active  Colonoscopy: Q3-5 yrs in past due to FH colon cancer in her dad, pre-cancerous polyps in pt. Pt unsure of last one, but thinks it was with Dr. Mechele Collin.   She does get adequate calcium but not Vitamin D in her diet.  She has SUI sx with sneeze, cough. She drinks 2 large cups coffee every morning. She has tried kegel exercises. Occas urge sx.   Labs with PCP  Past Medical History:  Diagnosis Date  . Abnormal mammogram 2010  . Colon polyp   . Depression   . Hyperlipidemia   . Ovarian cyst     Past Surgical History:  Procedure Laterality Date  . CESAREAN SECTION    . COLONOSCOPY  2009    Family History  Problem Relation Age of Onset  . Colon cancer Father   . Bladder Cancer Maternal Grandmother   . Ovarian cancer Maternal Grandmother     Social History   Socioeconomic History  . Marital status: Married    Spouse name: Not on file  . Number  of children: Not on file  . Years of education: Not on file  . Highest education level: Not on file  Occupational History  . Not on file  Social Needs  . Financial resource strain: Not on file  . Food insecurity:    Worry: Not on file    Inability: Not on file  . Transportation needs:    Medical: Not on file    Non-medical: Not on file  Tobacco Use  . Smoking status: Current Every Day Smoker    Packs/day: 1.00    Types: Cigarettes  . Smokeless tobacco: Never Used  Substance and Sexual Activity  . Alcohol use: Yes  . Drug use: Yes    Types: Marijuana  . Sexual activity: Yes    Birth control/protection: None  Lifestyle  . Physical activity:    Days per week: Not on file    Minutes per session: Not on file  . Stress: Not on file  Relationships  . Social connections:    Talks on phone: Not on file    Gets together: Not on file    Attends religious service: Not on file    Active member of club or organization: Not on file    Attends meetings of clubs or organizations: Not on file    Relationship status: Not  on file  . Intimate partner violence:    Fear of current or ex partner: Not on file    Emotionally abused: Not on file    Physically abused: Not on file    Forced sexual activity: Not on file  Other Topics Concern  . Not on file  Social History Narrative  . Not on file     Current Outpatient Medications:  .  DULoxetine (CYMBALTA) 30 MG capsule, Take 30 mg by mouth daily., Disp: , Rfl:  .  hydrOXYzine (ATARAX/VISTARIL) 25 MG tablet, TK 1/2 TO 2 TS PO UP TO TID PRA, Disp: , Rfl:  .  naproxen (NAPROSYN) 500 MG tablet, Take by mouth., Disp: , Rfl:  .  simvastatin (ZOCOR) 20 MG tablet, Take 20 mg by mouth daily., Disp: , Rfl:   ROS:  Review of Systems  Constitutional: Negative for fatigue, fever and unexpected weight change.  Respiratory: Negative for cough, shortness of breath and wheezing.   Cardiovascular: Negative for chest pain, palpitations and leg swelling.   Gastrointestinal: Negative for blood in stool, constipation, diarrhea, nausea and vomiting.  Endocrine: Negative for cold intolerance, heat intolerance and polyuria.  Genitourinary: Positive for urgency. Negative for dyspareunia, dysuria, flank pain, frequency, genital sores, hematuria, menstrual problem, pelvic pain, vaginal bleeding, vaginal discharge and vaginal pain.  Musculoskeletal: Positive for arthralgias. Negative for back pain, joint swelling and myalgias.  Skin: Negative for rash.  Neurological: Negative for dizziness, syncope, light-headedness, numbness and headaches.  Hematological: Negative for adenopathy.  Psychiatric/Behavioral: Positive for agitation and dysphoric mood. Negative for confusion, sleep disturbance and suicidal ideas. The patient is not nervous/anxious.     Objective: BP 130/80   Pulse 75   Ht  (1.651 m)   Wt 172 lb (78 kg)   LMP 06/18/2018 (Exact Date)   BMI 28.62 kg/m    Physical Exam Constitutional:      Appearance: She is well-developed.  Genitourinary:     Vulva, vagina, uterus, right adnexa and left adnexa normal.     No vulval lesion or tenderness noted.     No vaginal discharge, erythema or tenderness.     No cervical motion tenderness or polyp.     Uterus is not enlarged or tender.     No right or left adnexal mass present.     Right adnexa not tender.     Left adnexa not tender.  Neck:     Musculoskeletal: Normal range of motion.     Thyroid: No thyromegaly.  Cardiovascular:     Rate and Rhythm: Normal rate and regular rhythm.     Heart sounds: Normal heart sounds. No murmur.  Pulmonary:     Effort: Pulmonary effort is normal.     Breath sounds: Normal breath sounds.  Chest:     Breasts:        Right: No mass, nipple discharge, skin change or tenderness.        Left: No mass, nipple discharge, skin change or tenderness.  Abdominal:     Palpations: Abdomen is soft.     Tenderness: There is no abdominal tenderness. There is  no guarding.  Musculoskeletal: Normal range of motion.  Neurological:     Mental Status: She is alert and oriented to person, place, and time.     Cranial Nerves: No cranial nerve deficit.  Psychiatric:        Behavior: Behavior normal.  Vitals signs reviewed.     Assessment/Plan: Encounter for annual routine gynecological examination  Cervical cancer screening - Plan: IGP,CtNgTv,Apt HPV,rfx16/18,45  Screening for STD (sexually transmitted disease) - Plan: IGP,CtNgTv,Apt HPV,rfx16/18,45  Screening for HPV (human papillomavirus) - Repeat pap this yr. Will call with results.  - Plan: IGP,CtNgTv,Apt HPV,rfx16/18,45  Screening for breast cancer - Pt to sched mammo - Plan: MM 3D SCREEN BREAST BILATERAL  Screening for colon cancer - Refer to [facility] for scr colonoscopy due to FH/personal hx.  - Plan: Ambulatory referral to Gastroenterology  Family history of ovarian cancer - MyRisk testing discussed and pt declines today. Will f/u if desires.   Polyp of colon, unspecified part of colon, unspecified type - Plan: Ambulatory referral to Gastroenterology             GYN counsel mammography screening, menopause, Kegel's exercises     F/U  Return in about 1 year (around 07/11/2019).  Alicia B. Copland, PA-C 07/11/2018 4:38 PM

## 2018-07-17 ENCOUNTER — Encounter: Payer: Self-pay | Admitting: Obstetrics and Gynecology

## 2018-07-18 LAB — IGP,CTNGTV,APT HPV,RFX16/18,45
Chlamydia, Nuc. Acid Amp: NEGATIVE
GONOCOCCUS, NUC. ACID AMP: NEGATIVE
HPV APTIMA: POSITIVE — AB
HPV GENOTYPE 16: NEGATIVE
HPV GENOTYPE 18,45: POSITIVE — AB
TRICH VAG BY NAA: NEGATIVE

## 2018-08-01 ENCOUNTER — Ambulatory Visit (INDEPENDENT_AMBULATORY_CARE_PROVIDER_SITE_OTHER): Payer: Managed Care, Other (non HMO) | Admitting: Obstetrics & Gynecology

## 2018-08-01 ENCOUNTER — Other Ambulatory Visit: Payer: Self-pay

## 2018-08-01 ENCOUNTER — Encounter: Payer: Self-pay | Admitting: Obstetrics & Gynecology

## 2018-08-01 VITALS — BP 120/80 | Ht 64.0 in | Wt 166.0 lb

## 2018-08-01 DIAGNOSIS — R87810 Cervical high risk human papillomavirus (HPV) DNA test positive: Secondary | ICD-10-CM

## 2018-08-01 DIAGNOSIS — N72 Inflammatory disease of cervix uteri: Secondary | ICD-10-CM

## 2018-08-01 NOTE — Patient Instructions (Signed)
Colposcopy, Care After  This sheet gives you information about how to care for yourself after your procedure. Your health care provider may also give you more specific instructions. If you have problems or questions, contact your health care provider.  What can I expect after the procedure?  If you had a colposcopy without a biopsy, you can expect to feel fine right away, but you may have some spotting for a few days. You can go back to your normal activities.  If you had a colposcopy with a biopsy, it is common to have:   Soreness and pain. This may last for a few days.   Light-headedness.   Mild vaginal bleeding or dark-colored, grainy discharge. This may last for a few days. The discharge may be due to a solution that was used during the procedure. You may need to wear a sanitary pad during this time.   Spotting for at least 48 hours after the procedure.  Follow these instructions at home:     Take over-the-counter and prescription medicines only as told by your health care provider. Talk with your health care provider about what type of over-the-counter pain medicine and prescription medicine you can start taking again. It is especially important to talk with your health care provider if you take blood-thinning medicine.   Do not drive or use heavy machinery while taking prescription pain medicine.   For at least 3 days after your procedure, or as long as told by your health care provider, avoid:  ? Douching.  ? Using tampons.  ? Having sexual intercourse.   Continue to use birth control (contraception).   Limit your physical activity for the first day after the procedure as told by your health care provider. Ask your health care provider what activities are safe for you.   It is up to you to get the results of your procedure. Ask your health care provider, or the department performing the procedure, when your results will be ready.   Keep all follow-up visits as told by your health care provider.  This is important.  Contact a health care provider if:   You develop a skin rash.  Get help right away if:   You are bleeding heavily from your vagina or you are passing blood clots. This includes using more than one sanitary pad per hour for 2 hours in a row.   You have a fever or chills.   You have pelvic pain.   You have abnormal, yellow-colored, or bad-smelling vaginal discharge. This could be a sign of infection.   You have severe pain or cramps in your lower abdomen that do not get better with medicine.   You feel light-headed or dizzy, or you faint.  Summary   If you had a colposcopy without a biopsy, you can expect to feel fine right away, but you may have some spotting for a few days. You can go back to your normal activities.   If you had a colposcopy with a biopsy, you may notice mild pain and spotting for 48 hours after the procedure.   Avoid douching, using tampons, and having sexual intercourse for 3 days after the procedure or as long as told by your health care provider.   Contact your health care provider if you have bleeding, severe pain, or signs of infection.  This information is not intended to replace advice given to you by your health care provider. Make sure you discuss any questions you have with your   health care provider.  Document Released: 02/14/2013 Document Revised: 12/12/2015 Document Reviewed: 12/12/2015  Elsevier Interactive Patient Education  2019 [employer]

## 2018-08-01 NOTE — Progress Notes (Signed)
HPI:  Shelby Odonnell is a 48 y.o.  [id number]  who presents today for evaluation and management of abnormal cervical cytology.    Dysplasia History:  HPV POS (18,45) last 2 PAPs  ROS:  Pertinent items are noted in HPI.  OB History  Gravida Para Term Preterm AB Living  4 2 2  0 2 2  SAB TAB Ectopic Multiple Live Births  2 0 0 0 2    # Outcome Date GA Lbr Len/2nd Weight Sex Delivery Anes PTL Lv  4 Term 12/07/94   6 lb (2.722 kg) M CS-LTranv  N LIV     Complications: Placenta Previa  3 Term 12/07/92   7 lb (3.175 kg) M Vag-Spont  N LIV  2 SAB           1 SAB             Past Medical History:  Diagnosis Date  . Abnormal mammogram 2010  . Colon polyp   . Depression   . Hyperlipidemia   . Ovarian cyst     Past Surgical History:  Procedure Laterality Date  . CESAREAN SECTION    . COLONOSCOPY  2009, 05/2014   repeat due in 5 yrs at [facility]    SOCIAL HISTORY: Social History   Substance and Sexual Activity  Alcohol Use Yes   Social History   Substance and Sexual Activity  Drug Use Yes  . Types: Marijuana    Family History  Problem Relation Age of Onset  . Colon cancer Father   . Bladder Cancer Maternal Grandmother   . Ovarian cancer Maternal Grandmother    ALLERGIES:  Fluoxetine and Tetracyclines & related  Current Outpatient Medications on File Prior to Visit  Medication Sig Dispense Refill  . DULoxetine (CYMBALTA) 30 MG capsule Take 30 mg by mouth daily.    . hydrOXYzine (ATARAX/VISTARIL) 25 MG tablet TK 1/2 TO 2 TS PO UP TO TID PRA    . naproxen (NAPROSYN) 500 MG tablet Take by mouth.    . simvastatin (ZOCOR) 20 MG tablet Take 20 mg by mouth daily.     No current facility-administered medications on file prior to visit.     Physical Exam: -Vitals:  BP 120/80   Ht 5\' 4"  (1.626 m)   Wt 166 lb (75.3 kg)   LMP 06/16/2018   BMI 28.49 kg/m  GEN: WD, WN, NAD.  A+ O x 3, good mood and affect. ABD:  NT, ND.  Soft, no masses.  No hernias noted.   Pelvic:   Vulva:  Normal appearance.  No lesions.  Vagina: No lesions or abnormalities noted.  Support: Normal pelvic support.  Urethra No masses tenderness or scarring.  Meatus Normal size without lesions or prolapse.  Cervix: See below.  Anus: Normal exam.  No lesions.  Perineum: Normal exam.  No lesions.        Bimanual   Uterus: Normal size.  Non-tender.  Mobile.  AV.  Adnexae: No masses.  Non-tender to palpation.  Cul-de-sac: Negative for abnormality.   PROCEDURE: 1.  Urine Pregnancy Test:  not done 2.  Colposcopy performed with 4% acetic acid after verbal consent obtained                                         -Aceto-white Lesions Location(s): 3,7,11 o'clock.              -  Biopsy performed at 3,7,11 o'clock               -ECC indicated and performed: Yes.       -Biopsy sites made hemostatic with pressure, AgNO3, and/or Monsel's solution   -Satisfactory colposcopy: Yes.      -Evidence of Invasive cervical CA :  NO  ASSESSMENT:  Shelby Odonnell is a 48 y.o. [id number] here for  1. Cervical high risk HPV (human papillomavirus) test positive   .  PLAN: 1.  I discussed the grading system of pap smears and HPV high risk viral types.  We will discuss and base management after colpo results return. 2. Follow up PAP 6 months, vs intervention if high grade dysplasia identified 3. Treatment of persistantly abnormal PAP smears and cervical dysplasia, even mild, is discussed w pt today in detail, as well as the pros and cons of Cryo and LEEP procedures. Will consider and discuss after results.      Annamarie Major, MD, [facility], [facility] 08/01/2018  2:50 PM

## 2018-08-04 LAB — PATHOLOGY

## 2018-08-04 NOTE — Progress Notes (Signed)
Results of cervical biopsy: benign results with no suggestion of dysplasia or cancer.  Recommendation is for monitoring by way of PAP in 12 months (prior HPV changes only). Discussed w patient Shelby Major, MD, [facility], [facility] 08/04/2018  3:59 PM

## 2018-08-18 IMAGING — CR DG CHEST 2V
2 series · 2 of 2 positions shown · non-contrast
Comparison: None.

CLINICAL DATA: Pneumonia

EXAM:
CHEST  2 VIEW

[chest pa]
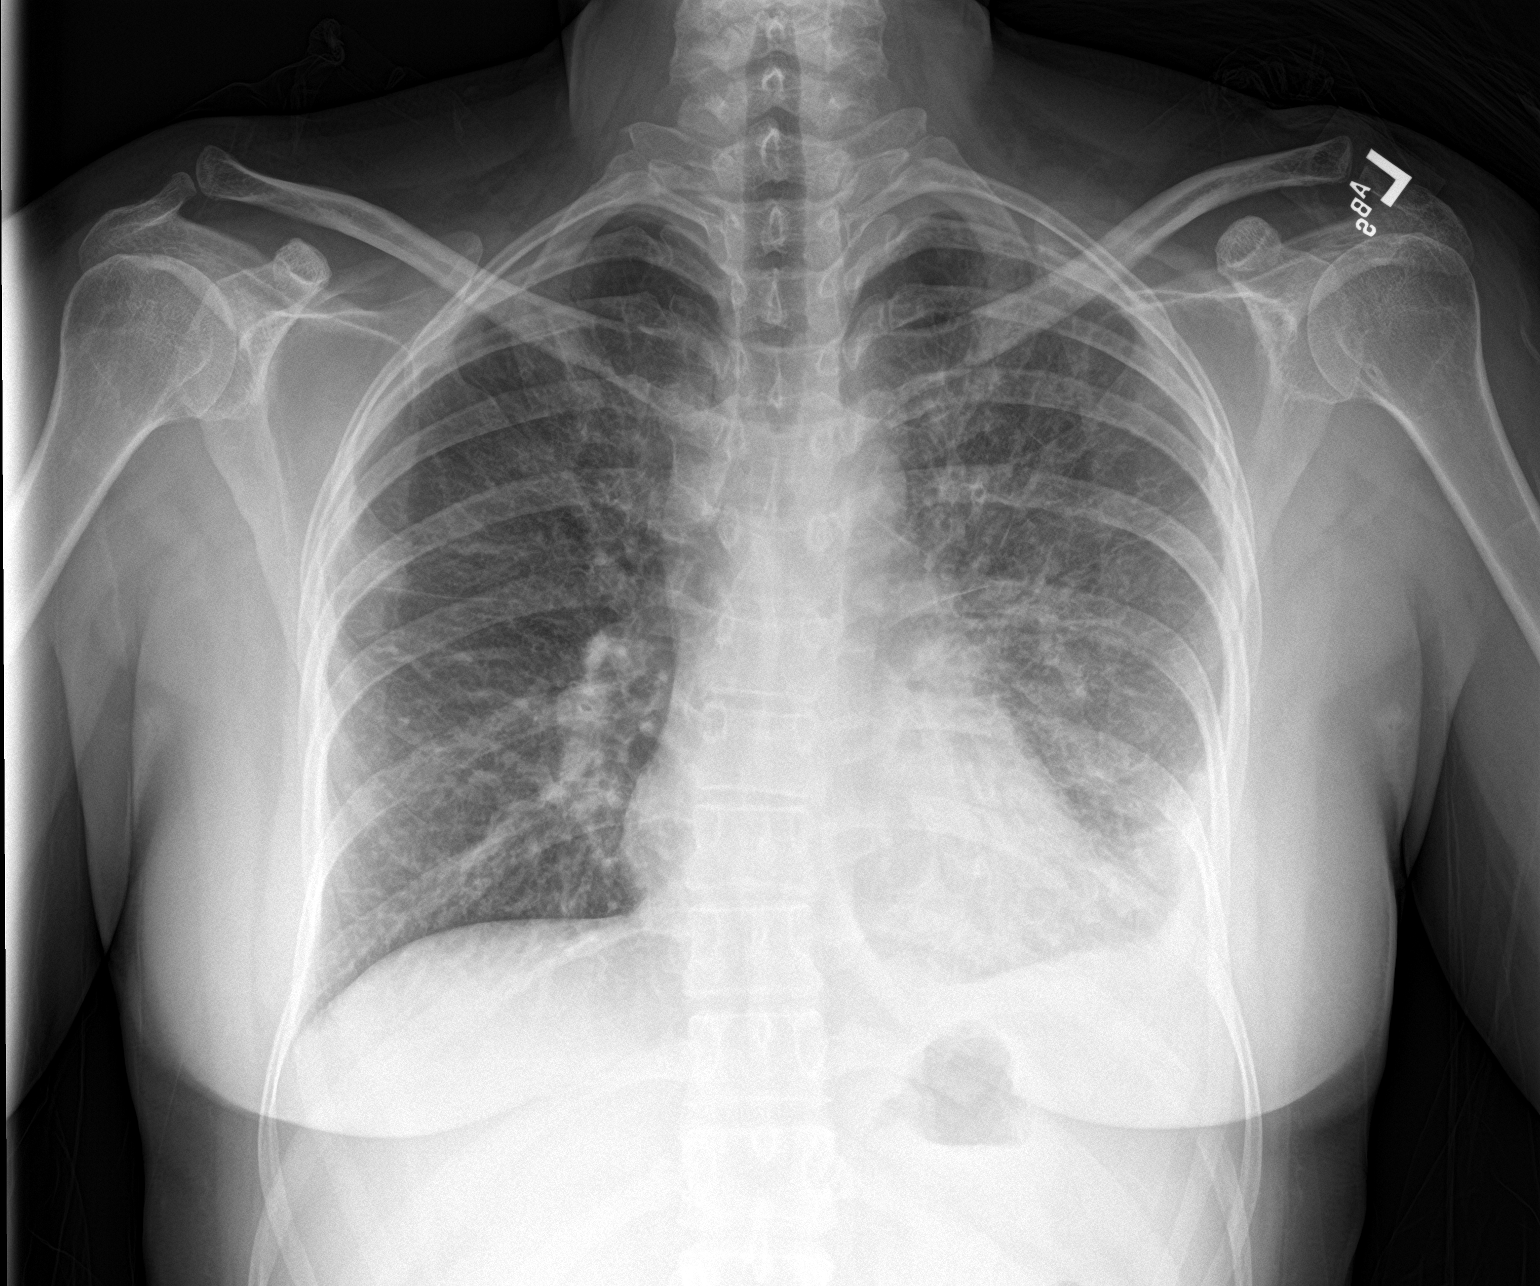

[chest lat]
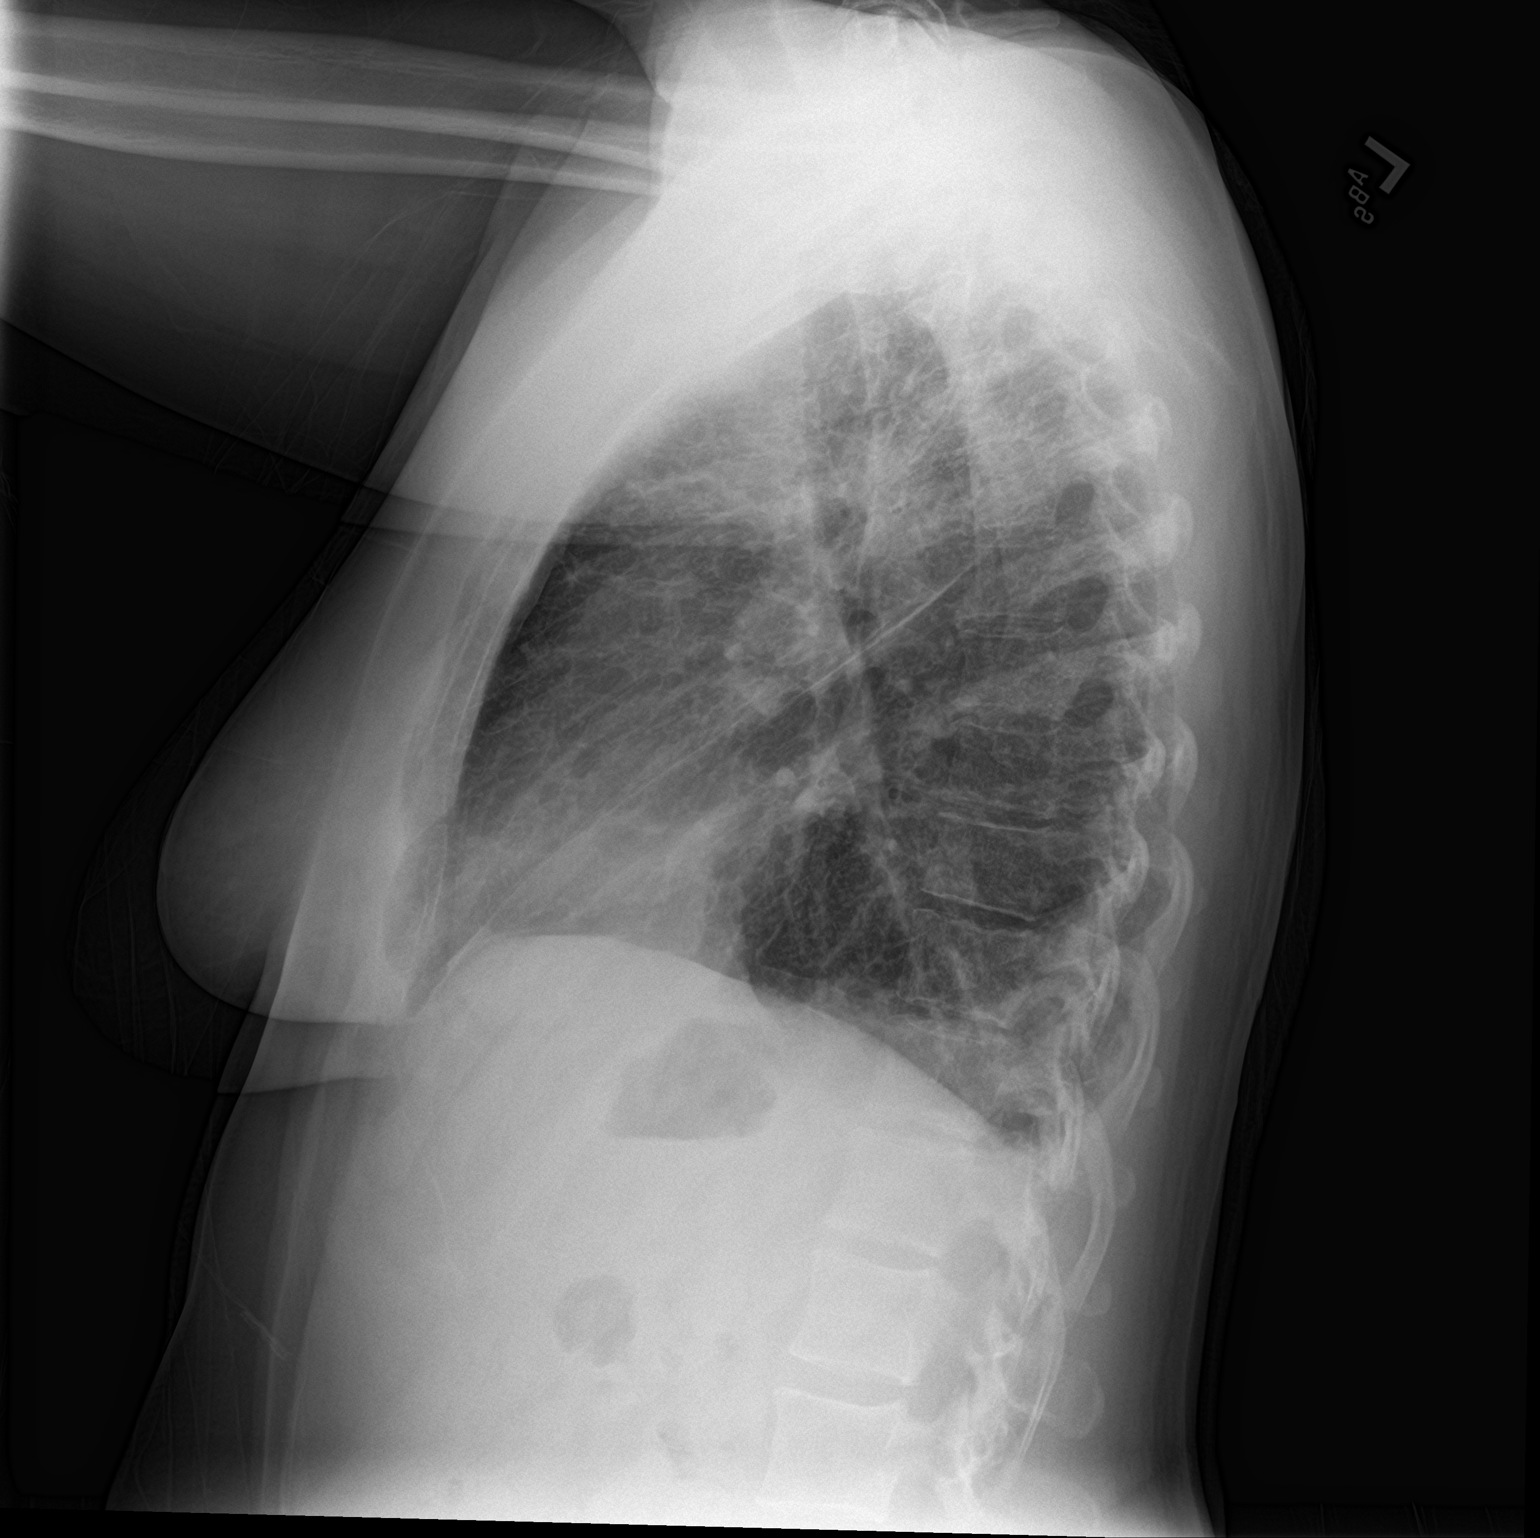

[2 of 2 positions shown; findings below may reference images not displayed]

FINDINGS: Small bilateral pleural effusions, left greater than right. The
right lung is clear. Mild atelectasis or infiltrate at the left lung
base. Normal heart size. No pneumothorax.
IMPRESSION: Small left greater than right pleural effusions with left basilar
atelectasis or infiltrate.

## 2018-10-05 ENCOUNTER — Other Ambulatory Visit: Payer: Self-pay | Admitting: Family Medicine

## 2018-10-05 DIAGNOSIS — R10812 Left upper quadrant abdominal tenderness: Secondary | ICD-10-CM

## 2018-10-08 NOTE — Progress Notes (Deleted)
[place], [facility]   No chief complaint on file.   HPI:      Ms. Shelby Odonnell is a 48 y.o. [phone] who LMP was No LMP recorded., presents today for ***    Past Medical History:  Diagnosis Date  . Abnormal mammogram 2010  . Colon polyp   . Depression   . Hyperlipidemia   . Ovarian cyst     Past Surgical History:  Procedure Laterality Date  . CESAREAN SECTION    . COLONOSCOPY  2009, 05/2014   [facility]    Family History  Problem Relation Age of Onset  . Colon cancer Father   . Bladder Cancer Maternal Grandmother   . Ovarian cancer Maternal Grandmother     Social History   Socioeconomic History  . Marital status: Married    Spouse name: Not on file  . Number of children: Not on file  . Years of education: Not on file  . Highest education level: Not on file  Occupational History  . Not on file  Social Needs  . Financial resource strain: Not on file  . Food insecurity:    Worry: Not on file    Inability: Not on file  . Transportation needs:    Medical: Not on file    Non-medical: Not on file  Tobacco Use  . Smoking status: Current Every Day Smoker    Packs/day: 1.00    Types: Cigarettes  . Smokeless tobacco: Never Used  Substance and Sexual Activity  . Alcohol use: Yes  . Drug use: Yes    Types: Marijuana  . Sexual activity: Yes    Birth control/protection: None  Lifestyle  . Physical activity:    Days per week: Not on file    Minutes per session: Not on file  . Stress: Not on file  Relationships  . Social connections:    Talks on phone: Not on file    Gets together: Not on file    Attends religious service: Not on file    Active member of club or organization: Not on file    Attends meetings of clubs or organizations: Not on file    Relationship status: Not on file  . Intimate partner violence:    Fear of current or ex partner: Not on file    Emotionally abused: Not on file    Physically abused: Not on file   Forced sexual activity: Not on file  Other Topics Concern  . Not on file  Social History Narrative  . Not on file    Outpatient Medications Prior to Visit  Medication Sig Dispense Refill  . DULoxetine (CYMBALTA) 30 MG capsule Take 30 mg by mouth daily.    . hydrOXYzine (ATARAX/VISTARIL) 25 MG tablet TK 1/2 TO 2 TS PO UP TO TID PRA    . naproxen (NAPROSYN) 500 MG tablet Take by mouth.    . simvastatin (ZOCOR) 20 MG tablet Take 20 mg by mouth daily.     No facility-administered medications prior to visit.       ROS:  Review of Systems BREAST: No symptoms   OBJECTIVE:   Vitals:  There were no vitals taken for this visit.  Physical Exam  Results: No results found for this or any previous visit (from the past 24 hour(s)).   Assessment/Plan: No diagnosis found.    No orders of the defined types were placed in this encounter.  No follow-ups on file.  Makaelah Cranfield B. Ia Leeb, PA-C 10/08/2018 5:23 PM

## 2018-10-09 ENCOUNTER — Ambulatory Visit: Payer: Managed Care, Other (non HMO) | Admitting: Obstetrics and Gynecology

## 2018-10-11 ENCOUNTER — Other Ambulatory Visit: Payer: Self-pay

## 2018-10-11 ENCOUNTER — Ambulatory Visit
Admission: RE | Admit: 2018-10-11 | Discharge: 2018-10-11 | Disposition: A | Discharge status: Home / Self Care | Payer: Managed Care, Other (non HMO) | Source: Ambulatory Visit | Attending: Family Medicine | Admitting: Family Medicine

## 2018-10-11 DIAGNOSIS — R10812 Left upper quadrant abdominal tenderness: Secondary | ICD-10-CM

## 2018-10-12 ENCOUNTER — Ambulatory Visit: Payer: Managed Care, Other (non HMO) | Admitting: Obstetrics and Gynecology

## 2018-11-08 ENCOUNTER — Telehealth: Payer: Self-pay

## 2018-11-08 NOTE — Telephone Encounter (Signed)
Per ABC, called pt to ask her if the appt she had for tomorrow 11/09/2018 was to repeat pap, if it was, shes not due until annual 3/21, OR if it was for some other issues going on. Pt stated she needed to cancel appt due to husband who had heart attack last Friday. The appt she had was to ask about possibly getting hysterectomy. Mentioned she had a lot of bleeding going on when she made the appt but as of now it has stopped/controlled. She will call back to schedule appt or possibly wait til annual.

## 2018-11-09 ENCOUNTER — Ambulatory Visit: Payer: Managed Care, Other (non HMO) | Admitting: Obstetrics and Gynecology

## 2019-05-14 ENCOUNTER — Other Ambulatory Visit: Payer: Managed Care, Other (non HMO)

## 2021-09-07 ENCOUNTER — Ambulatory Visit
Admission: EM | Admit: 2021-09-07 | Discharge: 2021-09-07 | Disposition: A | Discharge status: Home / Self Care | Payer: Managed Care, Other (non HMO) | Attending: Internal Medicine | Admitting: Internal Medicine

## 2021-09-07 ENCOUNTER — Encounter: Payer: Self-pay | Admitting: Emergency Medicine

## 2021-09-07 ENCOUNTER — Encounter: Payer: Self-pay | Admitting: Family Medicine

## 2021-09-07 DIAGNOSIS — M7541 Impingement syndrome of right shoulder: Secondary | ICD-10-CM | POA: Diagnosis not present

## 2021-09-07 MED ORDER — METHOCARBAMOL 500 MG PO TABS: 500.0000 mg | ORAL_TABLET | Freq: Every evening | ORAL | 0 refills | Status: DC | PRN

## 2021-09-07 MED ORDER — PREDNISONE 20 MG PO TABS: 40.0000 mg | ORAL_TABLET | Freq: Every day | ORAL | 0 refills | Status: AC

## 2021-09-07 NOTE — ED Triage Notes (Signed)
Pt reports right shoulder pain for about 3 months and now has radiated into bicep muscles and left shoulder. Denies any obvious injuries.  ?

## 2021-09-07 NOTE — Discharge Instructions (Addendum)
Gentle range of motion exercises ?Take medications as prescribe ?Please do not take ibuprofen/naproxen while taking prednisone.  You can take extra strength Tylenol if needed whilst on prednisone. ?Please do not drive or operate heavy machinery after taking muscle relaxants ?We will send you to sports medicine for further evaluation ?Heating pad use only 20 minutes on-20 minutes off cycle. ?

## 2021-09-08 NOTE — ED Provider Notes (Signed)
?[place] ? ? ? ?CSN: [id number] ?Arrival date & time: 09/07/21  [id number] ? ? ?  ? ?History   ?Chief Complaint ?Chief Complaint  ?Patient presents with  ? Shoulder Pain  ? ? ?HPI ?Shelby Odonnell is a 51 y.o. female comes to the urgent care with a 22-month history of right shoulder pain.  Pain has been persistent over the past 3 months.  Pain is sharp, aggravated by movement with no known relieving factors.  Pain radiates into the upper arm.  Patient previously had left shoulder pain which is at baseline.  No falls or trauma to either shoulders.  No neck pain.  No numbness, tingling or weakness in the upper extremities.  Patient has tried ibuprofen on a regular basis with no improvement in the symptoms.  No repetitive hand movement.  Patient has worsening pain if she tries to raise her arm above her head or put her upper extremity behind her back. ?HPI ? ?Past Medical History:  ?Diagnosis Date  ? Abnormal mammogram 2010  ? Colon polyp   ? Depression   ? Hyperlipidemia   ? Ovarian cyst   ? ? ?Patient Active Problem List  ? Diagnosis Date Noted  ? Family history of ovarian cancer 07/11/2018  ? Polyp of colon 07/11/2018  ? Tobacco use disorder 07/29/2016  ? Alcohol use disorder, mild, abuse 07/29/2016  ? Severe recurrent major depression without psychotic features ([place]) 07/27/2016  ? Overdose of antidepressant, intentional self-harm, initial encounter ([place]) 07/27/2016  ? ? ?Past Surgical History:  ?Procedure Laterality Date  ? CESAREAN SECTION    ? COLONOSCOPY  2009, 05/2014  ? repeat due in 5 yrs at [facility]  ? ? ?OB History   ? ? Gravida  ?4  ? Para  ?2  ? Term  ?2  ? Preterm  ?0  ? AB  ?2  ? Living  ?2  ?  ? ? SAB  ?2  ? IAB  ?0  ? Ectopic  ?0  ? Multiple  ?0  ? Live Births  ?2  ?   ?  ?  ? ? ? ?Home Medications   ? ?Prior to Admission medications   ?Medication Sig Start Date End Date Taking? Authorizing Provider  ?methocarbamol (ROBAXIN) 500 MG tablet Take 1 tablet (500 mg total) by mouth at bedtime as needed for  muscle spasms. 09/07/21  Yes Ovida Delagarza, Myrene Galas, MD  ?predniSONE (DELTASONE) 20 MG tablet Take 2 tablets (40 mg total) by mouth daily for 5 days. 09/07/21 09/12/21 Yes Yuriy Cui, Myrene Galas, MD  ?hydrOXYzine (ATARAX/VISTARIL) 25 MG tablet TK 1/2 TO 2 TS PO UP TO TID PRA 09/29/17   [provider]  ?naproxen (NAPROSYN) 500 MG tablet Take by mouth. 05/24/18   [provider]  ?simvastatin (ZOCOR) 20 MG tablet Take 20 mg by mouth daily. 05/31/16   [provider]  ? ? ?Family History ?Family History  ?Problem Relation Age of Onset  ? Colon cancer Father   ? Bladder Cancer Maternal Grandmother   ? Ovarian cancer Maternal Grandmother   ? ? ?Social History ?Social History  ? ?Tobacco Use  ? Smoking status: Former  ?  Packs/day: 1.00  ?  Types: Cigarettes  ?  Quit date: 07/08/2021  ?  Years since quitting: 0.1  ? Smokeless tobacco: Never  ?Vaping Use  ? Vaping Use: Never used  ?Substance Use Topics  ? Alcohol use: Yes  ? Drug use: Yes  ?  Types: Marijuana  ? ? ? ?  Allergies   ?Fluoxetine and Tetracyclines & related ? ? ?Review of Systems ?Review of Systems  ?Gastrointestinal: Negative.   ?Musculoskeletal:  Positive for arthralgias. Negative for back pain, joint swelling, myalgias, neck pain and neck stiffness.  ?Neurological:  Negative for headaches.  ? ? ?Physical Exam ?Triage Vital Signs ?ED Triage Vitals  ?Enc Vitals Group  ?   BP 09/07/21 0833 (!) 101/52  ?   Pulse Rate 09/07/21 0833 63  ?   Resp 09/07/21 0833 17  ?   Temp 09/07/21 0833 98.7 ?F (37.1 ?C)  ?   Temp Source 09/07/21 0833 Oral  ?   SpO2 09/07/21 0833 100 %  ?   Weight 09/07/21 0830 166 lb 0.1 oz (75.3 kg)  ?   Height 09/07/21 0830 5\' 4"  (1.626 m)  ?   Head Circumference --   ?   Peak Flow --   ?   Pain Score 09/07/21 0829 2  ?   Pain Loc --   ?   Pain Edu? --   ?   Excl. in [place]? --   ? ?No data found. ? ?Updated Vital Signs ?BP (!) 101/52 (BP Location: Left Arm)   Pulse 63   Temp 98.7 ?F (37.1 ?C) (Oral)   Resp 17   Ht 5\' 4"  (1.626 m)   Wt  75.3 kg   SpO2 100%   BMI 28.49 kg/m?  ? ?Visual Acuity ?Right Eye Distance:   ?Left Eye Distance:   ?Bilateral Distance:   ? ?Right Eye Near:   ?Left Eye Near:    ?Bilateral Near:    ? ?Physical Exam ?Vitals and nursing note reviewed.  ?Constitutional:   ?   General: She is not in acute distress. ?   Appearance: She is not ill-appearing.  ?Cardiovascular:  ?   Rate and Rhythm: Normal rate and regular rhythm.  ?Pulmonary:  ?   Effort: Pulmonary effort is normal.  ?   Breath sounds: Normal breath sounds.  ?Musculoskeletal:     ?   General: Tenderness present. No swelling, deformity or signs of injury. Normal range of motion.  ?   Right lower leg: No edema.  ?   Left lower leg: No edema.  ?   Comments: External rotation of the upper extremity is painful.  Tenderness on palpation over the rotator cuff  ?Skin: ?   General: Skin is warm.  ?   Coloration: Skin is not jaundiced.  ?   Findings: No bruising or erythema.  ?Neurological:  ?   General: No focal deficit present.  ?   Mental Status: She is alert.  ? ? ? ?UC Treatments / Results  ?Labs ?(all labs ordered are listed, but only abnormal results are displayed) ?Labs Reviewed - No data to display ? ?EKG ? ? ?Radiology ?No results found. ? ?Procedures ?Procedures (including critical care time) ? ?Medications Ordered in UC ?Medications - No data to display ? ?Initial Impression / Assessment and Plan / UC Course  ?I have reviewed the triage vital signs and the nursing notes. ? ?Pertinent labs & imaging results that were available during my care of the patient were reviewed by me and considered in my medical decision making (see chart for details). ? ?  ? ?1.  Right rotator cuff impingement syndrome: ?Failed treatment with ibuprofen ?Short course of oral steroids ?Muscle relaxant at bedtime-precautions given ?Sports medicine referral for further evaluation ?Return precautions given. ?Final Clinical Impressions(s) / UC Diagnoses  ? ?Final diagnoses:  ?  Rotator cuff  impingement syndrome, right  ? ? ? ?Discharge Instructions   ? ?  ?Gentle range of motion exercises ?Take medications as prescribe ?Please do not take ibuprofen/naproxen while taking prednisone.  You can take extra strength Tylenol if needed whilst on prednisone. ?Please do not drive or operate heavy machinery after taking muscle relaxants ?We will send you to sports medicine for further evaluation ?Heating pad use only 20 minutes on-20 minutes off cycle. ? ? ?ED Prescriptions   ? ? Medication Sig Dispense Auth. Provider  ? predniSONE (DELTASONE) 20 MG tablet Take 2 tablets (40 mg total) by mouth daily for 5 days. 10 tablet Cellie Dardis, Myrene Galas, MD  ? methocarbamol (ROBAXIN) 500 MG tablet Take 1 tablet (500 mg total) by mouth at bedtime as needed for muscle spasms. 20 tablet Maclain Cohron, Myrene Galas, MD  ? ?  ? ?PDMP not reviewed this encounter. ?  ?Chase Picket, MD ?09/08/21 1649 ? ?

## 2021-09-10 ENCOUNTER — Encounter: Payer: Managed Care, Other (non HMO) | Admitting: Family Medicine

## 2021-09-10 ENCOUNTER — Ambulatory Visit (INDEPENDENT_AMBULATORY_CARE_PROVIDER_SITE_OTHER): Payer: Managed Care, Other (non HMO) | Admitting: Family Medicine

## 2021-09-10 ENCOUNTER — Encounter: Payer: Self-pay | Admitting: Family Medicine

## 2021-09-10 VITALS — BP 128/84 | HR 68 | Ht 64.0 in | Wt 179.0 lb

## 2021-09-10 DIAGNOSIS — M62838 Other muscle spasm: Secondary | ICD-10-CM | POA: Diagnosis not present

## 2021-09-10 DIAGNOSIS — M47812 Spondylosis without myelopathy or radiculopathy, cervical region: Secondary | ICD-10-CM | POA: Insufficient documentation

## 2021-09-10 DIAGNOSIS — M7540 Impingement syndrome of unspecified shoulder: Secondary | ICD-10-CM

## 2021-09-10 MED ORDER — DICLOFENAC SODIUM 75 MG PO TBEC: 75.0000 mg | DELAYED_RELEASE_TABLET | Freq: Two times a day (BID) | ORAL | 0 refills | Status: DC

## 2021-09-10 NOTE — Progress Notes (Signed)
?  ? ?  Primary Care / Sports Medicine Office Visit ? ?Patient Information:  ?Patient ID: Shelby Odonnell, female DOB: Sep 25, 1970 Age: 51 y.o. MRN: [id number]  ? ?Shelby Odonnell is a pleasant 51 y.o. female presenting with the following: ? ?Chief Complaint  ?Patient presents with  ? Shoulder Pain  ?  Both right one is worse, right x3 months ago left x2-3 weeks, can raise both arms over your head without much pain, but can put right arm behind back, no injuries   ? ? ?Vitals:  ? 09/10/21 0931  ?BP: 128/84  ?Pulse: 68  ?SpO2: 99%  ? ?Vitals:  ? 09/10/21 0931  ?Weight: 179 lb (81.2 kg)  ?Height: 5\' 4"  (1.626 m)  ? ?Body mass index is 30.73 kg/m?. ? ?No results found.  ? ?Independent interpretation of notes and tests performed by another provider:  ? ?None ? ?Procedures performed:  ? ?None ? ?Pertinent History, Exam, Impression, and Recommendations:  ? ?Problem List Items Addressed This Visit   ? ?  ? Musculoskeletal and Integument  ? Rotator cuff impingement syndrome - Primary  ?  Right-hand-dominant patient patient with atraumatic right anterolateral shoulder pain x3 months, has noted involvement on her left shoulder over the past few weeks, does have mild neck discomfort and intermittent paresthesias into the upper extremities.  At onset, she does give history of possible overuse while having to help her 70 pound dog.  Has been dosing ibuprofen and utilizing ice with limited response.  Initially had night pain which is since improved. ? ?Examination reveals full painful range of motion with maximal flexion, limited internal rotation as well as extension right greater than left, ice and rotator cuff testing reveals 5/5 strength throughout with maximal pain during isolated supraspinatus testing and internal rotation, right greater than left, positive impingement bilaterally, positive speeds and Yergason's on the right, positive speeds on the left, tenderness about the subacromial space bilaterally, bicipital groove  bilaterally, and paraspinal cervical musculature.  She has a negative Spurling's test. ? ?Findings are most consistent with primary rotator cuff related impingement with focality to the supraspinatus bilaterally, right greater than left, secondary/compensatory involvement throughout the paraspinal cervical musculature spasm.  Plan for scheduled diclofenac, as needed methocarbamol, and close follow-up for reevaluation. ? ?  ?  ? Relevant Medications  ? diclofenac (VOLTAREN) 75 MG EC tablet  ?  ? Other  ? Cervical paraspinal muscle spasm  ?  See additional assessment(s) for plan details. ? ?  ?  ?  ? ?Orders & Medications ?Meds ordered this encounter  ?Medications  ? diclofenac (VOLTAREN) 75 MG EC tablet  ?  Sig: Take 1 tablet (75 mg total) by mouth 2 (two) times daily.  ?  Dispense:  30 tablet  ?  Refill:  0  ? ?No orders of the defined types were placed in this encounter. ?  ? ?Return in about 2 weeks (around 09/24/2021).  ?  ? ?09/26/2021, MD ? ? Primary Care Sports Medicine ?[facility] ?[facility] [place]  ? ?

## 2021-09-10 NOTE — Patient Instructions (Signed)
-   Start diclofenac every a.m. and every p.m. scheduled (take with food) until follow-up ?- Utilize ice at 20-minute intervals over the shoulder areas of pain twice daily and as needed ?- Can use nightly methocarbamol for muscle tightness pain, side effect can be drowsiness ?- Limit activity to necessary daily tasks using shoulder/neck symptoms as a guide ?- Return for follow-up in 2 weeks, contact us for any questions between now and then ?

## 2021-09-10 NOTE — Assessment & Plan Note (Signed)
Right-hand-dominant patient patient with atraumatic right anterolateral shoulder pain x3 months, has noted involvement on her left shoulder over the past few weeks, does have mild neck discomfort and intermittent paresthesias into the upper extremities.  At onset, she does give history of possible overuse while having to help her 70 pound dog.  Has been dosing ibuprofen and utilizing ice with limited response.  Initially had night pain which is since improved. ? ?Examination reveals full painful range of motion with maximal flexion, limited internal rotation as well as extension right greater than left, ice and rotator cuff testing reveals 5/5 strength throughout with maximal pain during isolated supraspinatus testing and internal rotation, right greater than left, positive impingement bilaterally, positive speeds and Yergason's on the right, positive speeds on the left, tenderness about the subacromial space bilaterally, bicipital groove bilaterally, and paraspinal cervical musculature.  She has a negative Spurling's test. ? ?Findings are most consistent with primary rotator cuff related impingement with focality to the supraspinatus bilaterally, right greater than left, secondary/compensatory involvement throughout the paraspinal cervical musculature spasm.  Plan for scheduled diclofenac, as needed methocarbamol, and close follow-up for reevaluation. ?

## 2021-09-10 NOTE — Assessment & Plan Note (Signed)
See additional assessment(s) for plan details. 

## 2021-09-21 ENCOUNTER — Other Ambulatory Visit: Payer: Self-pay

## 2021-09-21 ENCOUNTER — Encounter: Payer: Self-pay | Admitting: Family Medicine

## 2021-09-21 DIAGNOSIS — M7541 Impingement syndrome of right shoulder: Secondary | ICD-10-CM

## 2021-09-21 DIAGNOSIS — M62838 Other muscle spasm: Secondary | ICD-10-CM

## 2021-09-21 NOTE — Telephone Encounter (Signed)
Please review.  KP

## 2021-09-22 ENCOUNTER — Other Ambulatory Visit: Payer: Self-pay | Admitting: Family Medicine

## 2021-09-22 DIAGNOSIS — M7540 Impingement syndrome of unspecified shoulder: Secondary | ICD-10-CM

## 2021-09-23 ENCOUNTER — Ambulatory Visit
Admission: RE | Admit: 2021-09-23 | Discharge: 2021-09-23 | Disposition: A | Discharge status: Home / Self Care | Payer: Managed Care, Other (non HMO) | Source: Ambulatory Visit | Attending: Family Medicine | Admitting: Family Medicine

## 2021-09-23 ENCOUNTER — Ambulatory Visit
Admission: RE | Admit: 2021-09-23 | Discharge: 2021-09-23 | Disposition: A | Discharge status: Home / Self Care | Payer: Managed Care, Other (non HMO) | Attending: Family Medicine | Admitting: Family Medicine

## 2021-09-23 DIAGNOSIS — M7541 Impingement syndrome of right shoulder: Secondary | ICD-10-CM | POA: Insufficient documentation

## 2021-09-23 DIAGNOSIS — M62838 Other muscle spasm: Secondary | ICD-10-CM | POA: Insufficient documentation

## 2021-09-23 NOTE — Telephone Encounter (Signed)
Requested medication (s) are due for refill today: Yes ? ?Requested medication (s) are on the active medication list: Yes ? ?Last refill:  09/10/21 ? ?Future visit scheduled: Yes ? ?Notes to clinic:  Protocol indicates lab work is needed. ? ? ? ?Requested Prescriptions  ?Pending Prescriptions Disp Refills  ? diclofenac (VOLTAREN) 75 MG EC tablet [Pharmacy Med Name: DICLOFENAC SODIUM 75MG DR TABLETS] 30 tablet 0  ?  Sig: TAKE 1 TABLET(75 MG) BY MOUTH TWICE DAILY  ?  ? Analgesics:  NSAIDS Failed - 09/22/2021  3:32 AM  ?  ?  Failed - Manual Review: Labs are only required if the patient has taken medication for more than 8 weeks.  ?  ?  Failed - Cr in normal range and within 360 days  ?  Creatinine, Ser  ?Date Value Ref Range Status  ?07/28/2016 0.61 0.44 - 1.00 mg/dL Final  ?   ?  ?  Failed - HGB in normal range and within 360 days  ?  Hemoglobin  ?Date Value Ref Range Status  ?07/27/2016 12.1 12.0 - 16.0 g/dL Final  ?   ?  ?  Failed - PLT in normal range and within 360 days  ?  Platelets  ?Date Value Ref Range Status  ?07/27/2016 271 150 - 440 K/uL Final  ?   ?  ?  Failed - HCT in normal range and within 360 days  ?  HCT  ?Date Value Ref Range Status  ?07/27/2016 34.3 (L) 35.0 - 47.0 % Final  ?   ?  ?  Failed - eGFR is 30 or above and within 360 days  ?  GFR calc Af Amer  ?Date Value Ref Range Status  ?07/28/2016 >60 >60 mL/min Final  ?  Comment:  ?  (NOTE) ?The eGFR has been calculated using the CKD EPI equation. ?This calculation has not been validated in all clinical situations. ?eGFR's persistently <60 mL/min signify possible Chronic Kidney ?Disease. ?  ? ?GFR calc non Af Amer  ?Date Value Ref Range Status  ?07/28/2016 >60 >60 mL/min Final  ?   ?  ?  Failed - Valid encounter within last 12 months  ?  Recent Outpatient Visits   ? ?      ? 1 week ago Rotator cuff impingement syndrome, unspecified laterality  ? [facility] Montel Culver, MD  ? ?  ?  ?Future Appointments   ? ?        ? Tomorrow Montel Culver, MD [facility] [place]  ? ?  ? ? ?  ?  ?  Passed - Patient is not pregnant  ?  ?  ? ?

## 2021-09-24 ENCOUNTER — Encounter: Payer: Self-pay | Admitting: Family Medicine

## 2021-09-24 ENCOUNTER — Ambulatory Visit (INDEPENDENT_AMBULATORY_CARE_PROVIDER_SITE_OTHER): Payer: Managed Care, Other (non HMO) | Admitting: Family Medicine

## 2021-09-24 VITALS — BP 122/78 | HR 86 | Ht 64.0 in | Wt 180.2 lb

## 2021-09-24 DIAGNOSIS — M62838 Other muscle spasm: Secondary | ICD-10-CM | POA: Diagnosis not present

## 2021-09-24 DIAGNOSIS — M7540 Impingement syndrome of unspecified shoulder: Secondary | ICD-10-CM

## 2021-09-24 DIAGNOSIS — M797 Fibromyalgia: Secondary | ICD-10-CM

## 2021-09-24 DIAGNOSIS — M47812 Spondylosis without myelopathy or radiculopathy, cervical region: Secondary | ICD-10-CM | POA: Diagnosis not present

## 2021-09-24 MED ORDER — DULOXETINE HCL 30 MG PO CPEP: ORAL_CAPSULE | ORAL | 0 refills | Status: DC

## 2021-09-24 NOTE — Progress Notes (Signed)
Primary Care / Sports Medicine Office Visit  Patient Information:  Patient ID: TRYSTEN BERTI, female DOB: [birth year] Age: 51 y.o. MRN: [id number]   Shelby Odonnell is a pleasant 51 y.o. female presenting with the following:  Chief Complaint  Patient presents with   Shoulder Pain    Doesn't feel like the medication hasn't helped at all. Shoulders, hands, arms, feet, hips. Everything is hurting.     Vitals:   09/24/21 0805  BP: 122/78  Pulse: 86  SpO2: 98%   Vitals:   09/24/21 0805  Weight: 180 lb 3.2 oz (81.7 kg)  Height: 5\' 4"  (1.626 m)   Body mass index is 30.93 kg/m.  DG Shoulder Right  Result Date: 09/24/2021 CLINICAL DATA:  Bilateral shoulder pain, right greater than left. Patient reports right shoulder pain for 3 months and left for 3 weeks, and reports can not raise bilateral arms overhead. EXAM: RIGHT SHOULDER - 2+ VIEW COMPARISON:  None Available. FINDINGS: There is no evidence of fracture or dislocation. There is no evidence of arthropathy or other focal bone abnormality. Soft tissues are unremarkable. IMPRESSION: No acute osseous abnormality identified. Electronically Signed   By: 09/26/2021 M.D.   On: 09/24/2021 14:02     Independent interpretation of notes and tests performed by another provider:   Independent interpretation of cervical spine x-rays reveal loss and the reversal of the expected cervical lordosis, there is maximal intervertebral space narrowing and anterior endplate osteophytes at C4-5, secondarily to C5-6, no acute osseous process noted.  Independent interpretation of right shoulder x-rays without significant osseous abnormalities noted.  Procedures performed:   None  Pertinent History, Exam, Impression, and Recommendations:   Problem List Items Addressed This Visit       Musculoskeletal and Integument   Rotator cuff impingement syndrome    See additional assessment(s) for plan details.       Relevant Medications   DULoxetine  (CYMBALTA) 30 MG capsule   Cervical spondylosis    Recent x-rays do demonstrate multilevel cervical spondylosis with focality primarily at the C4-5 level, the symptoms are magnified by concern for comorbid fibromyalgia.  Plan for formal physical therapy, initiation of duloxetine with goal titration to 60 mg daily, and close follow-up in 2 months.       Relevant Medications   DULoxetine (CYMBALTA) 30 MG capsule     Other   Fibromyalgia - Primary    Patient with persistent and essentially unchanged pain at the neck and shoulder despite diclofenac 75 mg scheduled dosing.  Additionally, she brings up a constellation of other areas of pain in bilateral upper and lower extremities, torso.  We discussed the possibility of fibromyalgia including treatments such as duloxetine.  She states that she was forced to self discontinued duloxetine 90 mg roughly 3 months prior, which does coincide with the onset of her presenting symptoms.  She was dosing this medication for mood regulation.  Patient is amenable to restart of duloxetine at 30 mg x 1 week due to previous tolerability of the same, titration to 60 mg after 1 week and will remain on that dose for chronic musculoskeletal pain which we are managing.  For further dose increases she can defer to her PCP.  Additionally, we will have the patient start formal physical therapy and maintain close follow-up in 2 months for reassessment.  Chronic condition, symptomatic, Rx management       Relevant Medications   DULoxetine (CYMBALTA) 30 MG capsule  Orders & Medications Meds ordered this encounter  Medications   DULoxetine (CYMBALTA) 30 MG capsule    Sig: Take 1 capsule (30 mg total) by mouth every evening for 7 days, THEN 2 capsules (60 mg total) every evening.    Dispense:  113 capsule    Refill:  0   No orders of the defined types were placed in this encounter.    Return in about 2 months (around 11/24/2021).     Jerrol Banana, MD    Primary Care Sports Medicine [facility]

## 2021-09-24 NOTE — Assessment & Plan Note (Signed)
Recent x-rays do demonstrate multilevel cervical spondylosis with focality primarily at the C4-5 level, the symptoms are magnified by concern for comorbid fibromyalgia.  Plan for formal physical therapy, initiation of duloxetine with goal titration to 60 mg daily, and close follow-up in 2 months.

## 2021-09-24 NOTE — Assessment & Plan Note (Signed)
See additional assessment(s) for plan details. 

## 2021-09-24 NOTE — Assessment & Plan Note (Deleted)
See additional assessment(s) for plan details. 

## 2021-09-24 NOTE — Assessment & Plan Note (Signed)
Patient with persistent and essentially unchanged pain at the neck and shoulder despite diclofenac 75 mg scheduled dosing.  Additionally, she brings up a constellation of other areas of pain in bilateral upper and lower extremities, torso.  We discussed the possibility of fibromyalgia including treatments such as duloxetine.  She states that she was forced to self discontinued duloxetine 90 mg roughly 3 months prior, which does coincide with the onset of her presenting symptoms.  She was dosing this medication for mood regulation.  Patient is amenable to restart of duloxetine at 30 mg x 1 week due to previous tolerability of the same, titration to 60 mg after 1 week and will remain on that dose for chronic musculoskeletal pain which we are managing.  For further dose increases she can defer to her PCP.  Additionally, we will have the patient start formal physical therapy and maintain close follow-up in 2 months for reassessment.  Chronic condition, symptomatic, Rx management

## 2021-09-24 NOTE — Telephone Encounter (Signed)
Please advise 

## 2021-09-24 NOTE — Patient Instructions (Addendum)
-   Start duloxetine 30 mg daily x1 week - After 1 week increase to duloxetine 60 mg daily until follow-up - Start physical therapy, can contact number below for expedited scheduling - Return for follow-up in 2 months, contact us for any questions between now and then  [facility]:  [place]:  [phone]

## 2021-09-29 ENCOUNTER — Encounter: Payer: Self-pay | Admitting: Family Medicine

## 2021-09-30 ENCOUNTER — Other Ambulatory Visit: Payer: Self-pay

## 2021-09-30 DIAGNOSIS — M7540 Impingement syndrome of unspecified shoulder: Secondary | ICD-10-CM

## 2021-09-30 DIAGNOSIS — M62838 Other muscle spasm: Secondary | ICD-10-CM

## 2021-09-30 DIAGNOSIS — M797 Fibromyalgia: Secondary | ICD-10-CM

## 2021-09-30 MED ORDER — DULOXETINE HCL 30 MG PO CPEP: ORAL_CAPSULE | ORAL | 0 refills | Status: DC

## 2021-09-30 NOTE — Telephone Encounter (Signed)
Ok to send in 1 for 90days for insurance purposes?

## 2021-11-02 DIAGNOSIS — R87810 Cervical high risk human papillomavirus (HPV) DNA test positive: Secondary | ICD-10-CM | POA: Insufficient documentation

## 2021-11-02 NOTE — Progress Notes (Signed)
[place], [facility]   Chief Complaint  Patient presents with   Vaginal Bleeding    Spotting noticed a couple of days after intercourse, discharge, vag irritation, some itching    HPI:      Ms. Shelby Odonnell is a 51 y.o. [id number] whose LMP was Patient's last menstrual period was 06/11/2019 (approximate)., presents today for NP> 3 yrs eval of bleeding that started 1-2 days after sex about a week ago; was red with wiping only, has stopped. Had some mild dysmen. LMP about 2 yrs ago, no PMB till now. Hadn't been sexually active in 1 1/2 yrs. No pain/bleeding with sex. Pt questions if partner has STD. No condoms used, doesn't plan to be sexually active again. Has had increased vag d/c since, with burning, feels raw vaginally. Doesn't wear underwear, no urin sx, no meds to treat.  She is s/p endometrial ablation years ago for menorrhagia. She has a hx of ovar cysts in the past.  Last pap 07/11/18 was neg/pos HPV DNA x 2; s/p colpo with Dr. Tiburcio Pea 08/01/18 with neg bx  Patient Active Problem List   Diagnosis Date Noted   Cervical high risk human papillomavirus (HPV) DNA test positive 11/02/2021   Fibromyalgia 09/24/2021   Rotator cuff impingement syndrome 09/10/2021   Cervical spondylosis 09/10/2021   Family history of ovarian cancer 07/11/2018   Polyp of colon 07/11/2018   Tobacco use disorder 07/29/2016   Alcohol use disorder, mild, abuse 07/29/2016   Severe recurrent major depression without psychotic features (HCC) 07/27/2016   Overdose of antidepressant, intentional self-harm, initial encounter (HCC) 07/27/2016    Past Surgical History:  Procedure Laterality Date   CESAREAN SECTION     COLONOSCOPY  2009, 05/2014   repeat due in 5 yrs at [facility]    Family History  Problem Relation Age of Onset   Colon cancer Father 48   Bladder Cancer Maternal Grandmother        90s   Ovarian cancer Maternal Grandmother        53s    Social History   Socioeconomic History   Marital  status: Married    Spouse name: Not on file   Number of children: Not on file   Years of education: Not on file   Highest education level: Not on file  Occupational History   Not on file  Tobacco Use   Smoking status: Former    Packs/day: 1.00    Types: Cigarettes    Quit date: 07/08/2021    Years since quitting: 0.3   Smokeless tobacco: Never  Vaping Use   Vaping Use: Never used  Substance and Sexual Activity   Alcohol use: Yes   Drug use: Not Currently    Types: Marijuana   Sexual activity: Yes    Birth control/protection: None  Other Topics Concern   Not on file  Social History Narrative   Not on file   Social Determinants of Health   Financial Resource Strain: Low Risk  (09/10/2021)   Overall Financial Resource Strain (CARDIA)    Difficulty of Paying Living Expenses: Not hard at all  Food Insecurity: No Food Insecurity (09/10/2021)   Hunger Vital Sign    Worried About Running Out of Food in the Last Year: Never true    Ran Out of Food in the Last Year: Never true  Transportation Needs: No Transportation Needs (09/10/2021)   PRAPARE - Administrator, Civil Service (Medical): No  Lack of Transportation (Non-Medical): No  Physical Activity: Not on file  Stress: Not on file  Social Connections: Not on file  Intimate Partner Violence: Not At Risk (09/10/2021)   Humiliation, Afraid, Rape, and Kick questionnaire    Fear of Current or Ex-Partner: No    Emotionally Abused: No    Physically Abused: No    Sexually Abused: No    Outpatient Medications Prior to Visit  Medication Sig Dispense Refill   DULoxetine (CYMBALTA) 30 MG capsule Take 1 capsule (30 mg total) by mouth every evening for 7 days, THEN 2 capsules (60 mg total) every evening. 173 capsule 0   hydrOXYzine (ATARAX/VISTARIL) 25 MG tablet      simvastatin (ZOCOR) 20 MG tablet Take 20 mg by mouth daily.     methocarbamol (ROBAXIN) 500 MG tablet Take 1 tablet (500 mg total) by mouth at bedtime as needed for  muscle spasms. (Patient not taking: Reported on 09/10/2021) 20 tablet 0   Na Sulfate-K Sulfate-Mg Sulf 17.5-3.13-1.6 GM/177ML SOLN SMARTSIG:Bottle(s) By Mouth As Directed (Patient not taking: Reported on 11/03/2021)     diclofenac (VOLTAREN) 75 MG EC tablet Take 1 tablet (75 mg total) by mouth 2 (two) times daily. 30 tablet 0   No facility-administered medications prior to visit.      ROS:  Review of Systems  Constitutional:  Negative for fever.  Gastrointestinal:  Negative for blood in stool, constipation, diarrhea, nausea and vomiting.  Genitourinary:  Positive for vaginal bleeding and vaginal discharge. Negative for dyspareunia, dysuria, flank pain, frequency, hematuria, urgency and vaginal pain.  Musculoskeletal:  Negative for back pain.  Skin:  Negative for rash.   BREAST: No symptoms   OBJECTIVE:   Vitals:  BP 100/74   Ht 5\' 6"  (1.676 m)   Wt 178 lb (80.7 kg)   LMP 06/11/2019 (Approximate)   BMI 28.73 kg/m   Physical Exam Vitals reviewed.  Constitutional:      Appearance: She is well-developed.  Pulmonary:     Effort: Pulmonary effort is normal.  Genitourinary:    Pubic Area: No rash.      Labia:        Right: No rash, tenderness or lesion.        Left: No rash, tenderness or lesion.      Vagina: Vaginal discharge present. No erythema or tenderness.     Cervix: Friability present. No lesion.     Uterus: Normal. Not enlarged and not tender.      Adnexa: Right adnexa normal and left adnexa normal.       Right: No mass or tenderness.         Left: No mass or tenderness.      Musculoskeletal:        General: Normal range of motion.     Cervical back: Normal range of motion.  Skin:    General: Skin is warm and dry.  Neurological:     General: No focal deficit present.     Mental Status: She is alert and oriented to person, place, and time.  Psychiatric:        Mood and Affect: Mood normal.        Behavior: Behavior normal.        Thought Content: Thought  content normal.        Judgment: Judgment normal.     Results: Results for orders placed or performed in visit on 11/03/21 (from the past 24 hour(s))  POCT Wet Prep with KOH  Status: Normal   Collection Time: 11/03/21  5:08 PM  Result Value Ref Range   Trichomonas, UA Negative    Clue Cells Wet Prep HPF POC neg    Epithelial Wet Prep HPF POC     Yeast Wet Prep HPF POC neg    Bacteria Wet Prep HPF POC     RBC Wet Prep HPF POC     WBC Wet Prep HPF POC     KOH Prep POC Negative Negative  QUESTION TRICH ON WET PREP BUT NONE MOVING   Assessment/Plan: Vaginal bleeding--question PMB vs friable cx. Treat for cervicitis, check pap/STDs. Will f/u with results. If sx persist after tx, will eval for PMB with GYN u/s.   Cervicitis - Plan: doxycycline (VIBRAMYCIN) 100 MG capsule, Chlamydia/Gonococcus/Trichomonas, NAA; check labs, treat with Rx doxy. Pt states had mild rash as teenager with tetracycline, not sure if related  Vaginal discharge - Plan: POCT Wet Prep with KOH; neg wet prep. Rule out STDs.   Tinea cruris - Plan: clotrimazole-betamethasone (LOTRISONE) cream; Rx lotrisone crm BID for 2 wks, keep dry.   Cervical cancer screening - Plan: IGP, Aptima HPV,   Screening for HPV (human papillomavirus) - Plan: IGP, Aptima HPV,   Cervical high risk human papillomavirus (HPV) DNA test positive - Plan: IGP, Aptima HPV, repeat pap due.   Meds ordered this encounter  Medications   doxycycline (VIBRAMYCIN) 100 MG capsule    Sig: Take 1 capsule (100 mg total) by mouth 2 (two) times daily.    Dispense:  14 capsule    Refill:  0    Order Specific Question:   Supervising Provider    Answer:   Hildred Laser [[id number]]   clotrimazole-betamethasone (LOTRISONE) cream    Sig: Apply externally BID for 2 wks    Dispense:  45 g    Refill:  0    Order Specific Question:   Supervising Provider    Answer:   Hildred Laser [[id number]]      No follow-ups on file.  Hiyab Nhem B. Chang Tiggs,  PA-C 11/03/2021 5:10 PM

## 2021-11-03 ENCOUNTER — Ambulatory Visit (INDEPENDENT_AMBULATORY_CARE_PROVIDER_SITE_OTHER): Payer: Managed Care, Other (non HMO) | Admitting: Obstetrics and Gynecology

## 2021-11-03 ENCOUNTER — Encounter: Payer: Self-pay | Admitting: Obstetrics and Gynecology

## 2021-11-03 VITALS — BP 100/74 | Ht 66.0 in | Wt 178.0 lb

## 2021-11-03 DIAGNOSIS — N72 Inflammatory disease of cervix uteri: Secondary | ICD-10-CM

## 2021-11-03 DIAGNOSIS — R87810 Cervical high risk human papillomavirus (HPV) DNA test positive: Secondary | ICD-10-CM

## 2021-11-03 DIAGNOSIS — N898 Other specified noninflammatory disorders of vagina: Secondary | ICD-10-CM | POA: Diagnosis not present

## 2021-11-03 DIAGNOSIS — Z124 Encounter for screening for malignant neoplasm of cervix: Secondary | ICD-10-CM

## 2021-11-03 DIAGNOSIS — A599 Trichomoniasis, unspecified: Secondary | ICD-10-CM

## 2021-11-03 DIAGNOSIS — B356 Tinea cruris: Secondary | ICD-10-CM | POA: Diagnosis not present

## 2021-11-03 DIAGNOSIS — N939 Abnormal uterine and vaginal bleeding, unspecified: Secondary | ICD-10-CM | POA: Diagnosis not present

## 2021-11-03 DIAGNOSIS — Z1151 Encounter for screening for human papillomavirus (HPV): Secondary | ICD-10-CM

## 2021-11-03 LAB — POCT WET PREP WITH KOH
Clue Cells Wet Prep HPF POC: NEGATIVE
KOH Prep POC: NEGATIVE
Trichomonas, UA: NEGATIVE
Yeast Wet Prep HPF POC: NEGATIVE

## 2021-11-03 MED ORDER — DOXYCYCLINE HYCLATE 100 MG PO CAPS: 100.0000 mg | ORAL_CAPSULE | Freq: Two times a day (BID) | ORAL | 0 refills | Status: DC

## 2021-11-03 MED ORDER — CLOTRIMAZOLE-BETAMETHASONE 1-0.05 % EX CREA: TOPICAL_CREAM | CUTANEOUS | 0 refills | Status: DC

## 2021-11-06 ENCOUNTER — Encounter: Payer: Self-pay | Admitting: Obstetrics and Gynecology

## 2021-11-06 LAB — CHLAMYDIA/GONOCOCCUS/TRICHOMONAS, NAA
Chlamydia by NAA: NEGATIVE
Gonococcus by NAA: NEGATIVE
Trich vag by NAA: POSITIVE — AB

## 2021-11-06 MED ORDER — METRONIDAZOLE 500 MG PO TABS: ORAL_TABLET | ORAL | 0 refills | Status: DC

## 2021-11-06 NOTE — Addendum Note (Signed)
Addended by: Althea Grimmer B on: 11/06/2021 01:58 PM   Modules accepted: Orders

## 2021-11-09 LAB — IGP, APTIMA HPV: HPV Aptima: NEGATIVE

## 2021-11-24 ENCOUNTER — Ambulatory Visit (INDEPENDENT_AMBULATORY_CARE_PROVIDER_SITE_OTHER): Payer: Managed Care, Other (non HMO) | Admitting: Family Medicine

## 2021-11-24 ENCOUNTER — Inpatient Hospital Stay (INDEPENDENT_AMBULATORY_CARE_PROVIDER_SITE_OTHER): Payer: Managed Care, Other (non HMO) | Admitting: Radiology

## 2021-11-24 ENCOUNTER — Ambulatory Visit
Admission: RE | Admit: 2021-11-24 | Discharge: 2021-11-24 | Disposition: A | Discharge status: Home / Self Care | Payer: Managed Care, Other (non HMO) | Source: Ambulatory Visit | Attending: Family Medicine

## 2021-11-24 ENCOUNTER — Encounter: Payer: Self-pay | Admitting: Family Medicine

## 2021-11-24 ENCOUNTER — Ambulatory Visit
Admission: RE | Admit: 2021-11-24 | Discharge: 2021-11-24 | Disposition: A | Discharge status: Home / Self Care | Payer: Managed Care, Other (non HMO) | Attending: Family Medicine | Admitting: Family Medicine

## 2021-11-24 VITALS — BP 118/62 | HR 70 | Ht 64.0 in | Wt 180.0 lb

## 2021-11-24 DIAGNOSIS — M7541 Impingement syndrome of right shoulder: Secondary | ICD-10-CM

## 2021-11-24 DIAGNOSIS — M7542 Impingement syndrome of left shoulder: Secondary | ICD-10-CM

## 2021-11-24 DIAGNOSIS — M797 Fibromyalgia: Secondary | ICD-10-CM

## 2021-11-24 DIAGNOSIS — G8929 Other chronic pain: Secondary | ICD-10-CM | POA: Insufficient documentation

## 2021-11-24 DIAGNOSIS — M47812 Spondylosis without myelopathy or radiculopathy, cervical region: Secondary | ICD-10-CM

## 2021-11-24 DIAGNOSIS — M79641 Pain in right hand: Secondary | ICD-10-CM | POA: Diagnosis present

## 2021-11-24 MED ORDER — MELOXICAM 15 MG PO TABS: ORAL_TABLET | ORAL | 0 refills | Status: AC

## 2021-11-24 NOTE — Progress Notes (Signed)
Primary Care / Sports Medicine Office Visit  Patient Information:  Patient ID: Shelby Odonnell, female DOB: [birth year] Age: 51 y.o. MRN: [id number]   Shelby Odonnell is a pleasant 51 y.o. female presenting with the following:  Chief Complaint  Patient presents with   Fibromyalgia    Rotator cuff impingement syndrome, unspecified laterality  No changes in pain, both shoulders are hurting now, Cymbalta is helping emotionally not physically.     Vitals:   11/24/21 0804  BP: 118/62  Pulse: 70  SpO2: 98%   Vitals:   11/24/21 0804  Weight: 180 lb (81.6 kg)  Height: 5\' 4"  (1.626 m)   Body mass index is 30.9 kg/m.     Independent interpretation of notes and tests performed by another provider:   None  Procedures performed:   Procedure:  Injection of right subacromial space under ultrasound guidance. Ultrasound guidance utilized for in-plane approach to the right subacromial space, hypoechoic response from injectate noted, no sonographic evidence of tendinopathy Samsung HS60 device utilized with permanent recording / reporting. Verbal informed consent obtained and verified. Skin prepped in a sterile fashion. Ethyl chloride for topical local analgesia.  Completed without difficulty and tolerated well. Medication: triamcinolone acetonide 40 mg/mL suspension for injection 1 mL total and 2 mL lidocaine 1% without epinephrine utilized for needle placement anesthetic Advised to contact for fevers/chills, erythema, induration, drainage, or persistent bleeding.  Procedure:  Injection of left subacromial space under ultrasound guidance. Ultrasound guidance utilized for in-plane approach to the left subacromial space, no clear tendinopathy noted Samsung HS60 device utilized with permanent recording / reporting. Verbal informed consent obtained and verified. Skin prepped in a sterile fashion. Ethyl chloride for topical local analgesia.  Completed without difficulty and tolerated  well. Medication: triamcinolone acetonide 40 mg/mL suspension for injection 1 mL total and 2 mL lidocaine 1% without epinephrine utilized for needle placement anesthetic Advised to contact for fevers/chills, erythema, induration, drainage, or persistent bleeding.   Pertinent History, Exam, Impression, and Recommendations:   Problem List Items Addressed This Visit       Musculoskeletal and Integument   Rotator cuff impingement syndrome - Primary    Bilateral essentially symptomatic rotator cuff related impingement in the setting of underlying cervical spondylosis and focality to the supraspinatus bilaterally.  While she has noted initial positive response from duloxetine for comorbid fibromyalgia, we will proceed with targeted treatments with bilateral subacromial corticosteroid injections.  Additionally meloxicam and formal PT.  Plan for close follow-up in 6 weeks.      Relevant Medications   meloxicam (MOBIC) 15 MG tablet   Other Relevant Orders   LIMITED JOINT SPACE STRUCTURES UP BILAT (Completed)   Ambulatory referral to Physical Therapy   [place] LIMITED JOINT SPACE STRUCTURES UP BILAT (Completed)   Ambulatory referral to Physical Therapy   Cervical spondylosis    See additional assessment(s) for plan details.  Starting meloxicam, formal PT, close follow-up in 6 weeks.      Relevant Medications   meloxicam (MOBIC) 15 MG tablet   Other Relevant Orders   Ambulatory referral to Physical Therapy     Other   Fibromyalgia    Has shown response to duloxetine, that being said, given her focal findings to shoulders we will proceed with additional treatment options.      Relevant Medications   meloxicam (MOBIC) 15 MG tablet   Other Visit Diagnoses     Chronic pain of right hand  Relevant Medications   meloxicam (MOBIC) 15 MG tablet   Other Relevant Orders   DG Hand Complete Right        Orders & Medications Meds ordered this encounter  Medications   meloxicam (MOBIC)  15 MG tablet    Sig: Take 1 tablet (15 mg total) by mouth daily for 14 days, THEN 1 tablet (15 mg total) daily as needed for up to 14 days for pain.    Dispense:  30 tablet    Refill:  0   Orders Placed This Encounter  Procedures   [place] LIMITED JOINT SPACE STRUCTURES UP BILAT   DG Hand Complete Right   Ambulatory referral to Physical Therapy     Return in about 6 weeks (around 01/05/2022).     Jerrol Banana, MD   Primary Care Sports Medicine [facility]

## 2021-11-24 NOTE — Patient Instructions (Addendum)
You have just been given a cortisone injection to reduce pain and inflammation. After the injection you may notice immediate relief of pain as a result of the Lidocaine. It is important to rest the area of the injection for 24 to 48 hours after the injection. There is a possibility of some temporary increased discomfort and swelling for up to 72 hours until the cortisone begins to work. If you do have pain, simply rest the joint and use ice. If you can tolerate over the counter medications, you can try Tylenol, Aleve, or Advil for added relief per package instructions. -As above, relative rest x2 days and gradual return normal activity - Dose meloxicam once daily x2 weeks (take with food) - After 2 weeks, dose meloxicam once daily on as-needed basis - Start formal physical therapy, can contact number below for expedited scheduling - Return for follow-up in 6 weeks [facility]:  [place]:  [phone]

## 2021-11-24 NOTE — Assessment & Plan Note (Signed)
See additional assessment(s) for plan details.  Starting meloxicam, formal PT, close follow-up in 6 weeks.

## 2021-11-24 NOTE — Assessment & Plan Note (Signed)
Bilateral essentially symptomatic rotator cuff related impingement in the setting of underlying cervical spondylosis and focality to the supraspinatus bilaterally.  While she has noted initial positive response from duloxetine for comorbid fibromyalgia, we will proceed with targeted treatments with bilateral subacromial corticosteroid injections.  Additionally meloxicam and formal PT.  Plan for close follow-up in 6 weeks.

## 2021-11-24 NOTE — Assessment & Plan Note (Signed)
Has shown response to duloxetine, that being said, given her focal findings to shoulders we will proceed with additional treatment options.

## 2021-12-02 ENCOUNTER — Ambulatory Visit: Payer: Managed Care, Other (non HMO) | Attending: Family Medicine | Admitting: Physical Therapy

## 2021-12-02 DIAGNOSIS — G8929 Other chronic pain: Secondary | ICD-10-CM | POA: Insufficient documentation

## 2021-12-02 DIAGNOSIS — M542 Cervicalgia: Secondary | ICD-10-CM | POA: Diagnosis present

## 2021-12-02 DIAGNOSIS — M7542 Impingement syndrome of left shoulder: Secondary | ICD-10-CM | POA: Insufficient documentation

## 2021-12-02 DIAGNOSIS — M25511 Pain in right shoulder: Secondary | ICD-10-CM | POA: Insufficient documentation

## 2021-12-02 DIAGNOSIS — M6281 Muscle weakness (generalized): Secondary | ICD-10-CM | POA: Diagnosis present

## 2021-12-02 DIAGNOSIS — M25512 Pain in left shoulder: Secondary | ICD-10-CM | POA: Insufficient documentation

## 2021-12-02 DIAGNOSIS — M7541 Impingement syndrome of right shoulder: Secondary | ICD-10-CM | POA: Diagnosis not present

## 2021-12-02 DIAGNOSIS — M47812 Spondylosis without myelopathy or radiculopathy, cervical region: Secondary | ICD-10-CM | POA: Insufficient documentation

## 2021-12-02 NOTE — Therapy (Signed)
OUTPATIENT PHYSICAL THERAPY SHOULDER EVALUATION   Patient Name: KEISHAWN DARSEY MRN: [id number] DOB:Sep 28, 1970, 51 y.o., female Today's Date: 12/05/2021   PT End of Session - 12/05/21 0755     Visit Number 1    Number of Visits 13    Date for PT Re-Evaluation 01/13/22    Authorization Type [employer], 60 combined PT/OT/Speech per year    Progress Note Due on Visit 10    PT Start Time 1635    PT Stop Time 1720    PT Time Calculation (min) 45 min    Activity Tolerance Patient tolerated treatment well;Patient limited by pain    Behavior During Therapy [facility] for tasks assessed/performed             Past Medical History:  Diagnosis Date   Abnormal mammogram 2010   Colon polyp    Depression    Hyperlipidemia    Ovarian cyst    Past Surgical History:  Procedure Laterality Date   CESAREAN SECTION     COLONOSCOPY  2009, 05/2014   repeat due in 5 yrs at [facility]   Patient Active Problem List   Diagnosis Date Noted   Cervical high risk human papillomavirus (HPV) DNA test positive 11/02/2021   Fibromyalgia 09/24/2021   Rotator cuff impingement syndrome 09/10/2021   Cervical spondylosis 09/10/2021   Family history of ovarian cancer 07/11/2018   Polyp of colon 07/11/2018   Tobacco use disorder 07/29/2016   Alcohol use disorder, mild, abuse 07/29/2016   Severe recurrent major depression without psychotic features (HCC) 07/27/2016   Overdose of antidepressant, intentional self-harm, initial encounter (HCC) 07/27/2016    PCP: [facility]  REFERRING PROVIDER: Jerrol Banana, MD  REFERRING DIAGNOSIS:  [phone] (ICD-10-CM) - Cervical spondylosis  M75.42 (ICD-10-CM) - Rotator cuff impingement syndrome of left shoulder  M75.41 (ICD-10-CM) - Rotator cuff impingement syndrome of right shoulder    THERAPY DIAG: Chronic right shoulder pain  Chronic left shoulder pain  RATIONALE FOR EVALUATION AND TREATMENT: Rehabilitation  ONSET DATE: 06/10/2021  FOLLOW UP APPT WITH  PROVIDER: Yes; scheduled for 01/05/2022   SUBJECTIVE:                                                                                                                                                                                         Chief Complaint: Patient is a 51 year old female with referral for cervical spondylosis and bilateral rotator cuff impingement syndrome.  Pertinent History Patient is a 51 year old female with diagnosis of fibromyalgia from referring provider with referral for cervical spondylosis and bilateral rotator cuff impingement syndrome. Patient reports that subacromial injections recently have notably helped. Patient  reports over this past winter she was caring for sick dog and had to lift and move 70-lb dog. Atraumatic onset. She reports her symptoms began in her R shoulder; she states she then began to have notable pain in her L shoulder. Patient reports making changes to her home to limit heavy lifting with her dog. Patient reports pain along ACJ region along both shoulders. Pt reports pain with sleeping with scapular elevation in lying. Pt reports difficulty abducting greater than 90 degrees. Patient reports some tightness in posterior cervical spine, but she does not feel pain in her neck. Patient reports she is generally sedentary and is mainly active when walking her dog 4x/day. Pt quit smoking in March 2023. Patient reports improved sleep quality following her recent subacromial injections - she feels these helped significantly.   Pain:  Pain Intensity: Present: 0/10 Pain location: Along ACJ and referred down to lateral arm on either side Pain Quality: burning  Radiating: Yes  Numbness/Tingling: Yes, pt reports paresthesias at night with arm tucked in her side at night; no numbness during the day  Aggravating factors: reaching, rotating her shoulder, reaching out/ER and ABD combined, overhead activity, rapid HABD/"flicking fly" Relieving factors: injections, lying  back on physioball 24-hour pain behavior: None History of prior shoulder or neck/shoulder injury, pain, surgery, or therapy: Yes, persistent "knot" in R periscapular region over last 20 years  Dominant hand: right Imaging: Yes   09/23/21 Cervical spine IMPRESSION: 1. Lower cervical spondylosis, with associated left predominant neural foraminal encroachment at C4-5 and C5-6. 2. No acute bony abnormalities.    Prior level of function: Independent Occupational demands: Works from home for [employer], pt sits in office chair - completes mainly computer work  Hobbies: Patient's hobbies are largely sedentary; she goes walking during the day with her dog 4x/day Red flags (personal history of cancer, chills/fever, night sweats, nausea, vomiting, unrelenting pain): Negative  Precautions: None  Weight Bearing Restrictions: No  Living Environment Lives with: lives alone Lives in: House/apartment Difficulty with reaching under cabinets and reaching into dryer   Patient Goals: Improved shoulder ROM, able to complete more home-based therapy     OBJECTIVE:   Patient Surveys  FOTO: 21 / goal score of 60  Cognition Patient is oriented to person, place, and time.  Recent memory is intact.  Remote memory is intact.  Attention span and concentration are intact.  Expressive speech is intact.  Patient's fund of knowledge is within normal limits for educational level.    Gross Musculoskeletal Assessment Tremor: None Bulk: Normal Tone: Normal   Posture Rounded shoulders, self-selected kyphotic sitting and standing posture; forward head  Cervical Screen AROM: WFL and painless with overpressure in all planes Spurlings A (ipsilateral lateral flexion/axial compression): R: Negative L: Negative Cervical compression/distraction: Negative   AROM  AROM (Normal range in degrees) AROM 12/05/2021  Cervical  Flexion (50) WNL  Extension (80) WNL  Right lateral flexion (45) WNL  Left  lateral flexion (45) WNL  Right rotation (85) WNL  Left rotation (85) WNL    Right Left  Shoulder    Flexion 133 132  Extension 55 55  Abduction 85 85  External Rotation 73 80  Internal Rotation    Hands Behind Head T3 T3  Hands Behind Back S2 L5      Elbow    Flexion WNL WNL  Extension WNL WNL  Pronation WNL WNL  Supination WNL WNL  (* = pain; Blank rows = not tested)    UE  MMT:  MMT (out of 5) Right 12/05/2021 Left 12/05/2021  Cervical (isometric)  Flexion WNL  Extension WNL  Lateral Flexion WNL WNL  Rotation WNL WNL      Shoulder   Flexion 4-* 4-*  Extension    Abduction 3+* 3+*  External rotation 4 4*  Internal rotation 4* (R UT) 4  Horizontal abduction    Horizontal adduction    Lower Trapezius    Rhomboids        Elbow  Flexion 5 5  Extension 4-* 5  Pronation    Supination        (* = pain; Blank rows = not tested)   Sensation Deferred   Palpation  Location LEFT  RIGHT           Subocciptials    Cervical paraspinals    Upper Trapezius    Levator Scapulae    Rhomboid Major/Minor    Sternoclavicular joint    Acromioclavicular joint    Coracoid process    Long head of biceps 1 2  Supraspinatus    Infraspinatus    Subscapularis    Teres Minor    Teres Major    Pectoralis Major    Pectoralis Minor    Anterior Deltoid 1 1  Lateral Deltoid    Posterior Deltoid    Latissimus Dorsi    Sternocleidomastoid    (Blank rows = not tested) Graded on 0-4 scale (0 = no pain, 1 = pain, 2 = pain with wincing/grimacing/flinching, 3 = pain with withdrawal, 4 = unwilling to allow palpation), (Blank rows = not tested)    SPECIAL TESTS Rotator Cuff  Drop Arm Test: Negative Painful Arc (Pain from 60 to 120 degrees scaption): Positive Infraspinatus Muscle Test: Positive Empty Can: R Positive, L Positive  If all 3 tests positive, the probability of a full-thickness rotator cuff tear is 91%  Subacromial Impingement Hawkins-Kennedy: Positive Neer  (Block scapula, PROM flexion): Positive Painful Arc (Pain from 60 to 120 degrees scaption): Positive External Rotation Resistance: Positive Horizontal Adduction: Not examined Scapular Assist: Negative    TODAY'S TREATMENT  Patient education on current condition, anatomy involved, prognosis, plan of care. Discussion on activity modification to prevent flare-up of condition, including avoidance of repetitive overhead motion, having frequently used items at table height, limiting heavy lifting temporarily.     PATIENT EDUCATION:  Education details: Plan of care, see above for patient education details  Person educated: Patient Education method: Explanation Education comprehension: verbalized understanding   HOME EXERCISE PROGRAM: Formal HEP to be initiated next visit    ASSESSMENT:  CLINICAL IMPRESSION: Patient is a 51 y.o. female who was seen today for physical therapy evaluation and treatment for shoulder pain with referring diagnoses of cervical spondylosis and bilateral rotator cuff impingement syndrome. Negative provocative tests for cervical spine today. Patient had significant relief from subacromial injections given by referring provider, indicative of likely primarily involved pain generator. Objective impairments include decreased ROM, decreased strength, hypomobility, impaired UE functional use, postural dysfunction, and pain. These impairments are limiting patient from meal prep, cleaning, laundry, and driving. Personal factors including Time since onset of injury/illness/exacerbation and comorbid  depression re also affecting patient's functional outcome. Patient will benefit from skilled PT to address above impairments and improve overall function.  REHAB POTENTIAL: Good  CLINICAL DECISION MAKING: Evolving/moderate complexity  EVALUATION COMPLEXITY: Moderate   GOALS: Goals reviewed with patient? Yes  SHORT TERM GOALS: Target date: 01/02/2022  Pt will be independent  with HEP to  improve strength and decrease neck pain to improve pain-free function at home and work. Baseline: 12/02/21: Formal HEP to be initiated on visit # 2.  Goal status: INITIAL   LONG TERM GOALS: Target date: 01/30/2022  Pt will increase FOTO to at least 67 to demonstrate significant improvement in function at home and work related to neck pain  Baseline: 12/02/21: FOTO 53 Goal status: INITIAL  2. Patient will have shoulder flexion and abduction AROM to 150 or greater without reproduction of pain indicative of ability to perform overhead activity and functional reaching as needed for self-care ADLs and household tasks Baseline: 12/02/21: Shoulder AROM R/L Flexion 133/132, 85/85 Goal status: INITIAL  3. Patient will be able to complete simulation for lifting her dog from one surface to another by moving box with modified grip from standard chair to adjacent treatment table without reproduction of pain and sound lifting technique Baseline: 12/02/21: Significant limitation with lifting, increase in pain with assisting her 70-lb dog Goal status: INITIAL  4. Pt will increase strength of bilateral shoulder flexion, ABD, ER, IR to at least 4+ or greater MMT grade in order to demonstrate improvement in strength and function         Baseline: 12/02/21: Strength 3+ to 4 with pain reproduction with MMTs (see chart above for specifics).   Goal status: INITIAL   PLAN: PT FREQUENCY: 1-2x/week  PT DURATION: 6 weeks  PLANNED INTERVENTIONS: Therapeutic exercises, Therapeutic activity, Neuromuscular re-education, Patient/Family education, Joint manipulation, Joint mobilization, Dry Needling, Electrical stimulation, Spinal manipulation, Spinal mobilization, Cryotherapy, Moist heat, Traction, and Manual therapy  PLAN FOR NEXT SESSION: Continue with manual therapy to improve pain with elevation of bilateral shoulders, exercises to gradually introduced increased shoulder elevation ROM without repetitively  lifting into painful arc, postural re-edu, periscapular isotonics and RTC strengthening     Consuela Mimes, PT, DPT #[id number]  Gertie Exon 12/05/2021, 7:57 AM

## 2021-12-05 ENCOUNTER — Encounter: Payer: Self-pay | Admitting: Physical Therapy

## 2021-12-07 ENCOUNTER — Encounter: Payer: Self-pay | Admitting: Physical Therapy

## 2021-12-07 ENCOUNTER — Ambulatory Visit: Payer: Managed Care, Other (non HMO) | Admitting: Physical Therapy

## 2021-12-07 DIAGNOSIS — G8929 Other chronic pain: Secondary | ICD-10-CM

## 2021-12-07 DIAGNOSIS — M25511 Pain in right shoulder: Secondary | ICD-10-CM | POA: Diagnosis not present

## 2021-12-07 DIAGNOSIS — M542 Cervicalgia: Secondary | ICD-10-CM

## 2021-12-07 DIAGNOSIS — M6281 Muscle weakness (generalized): Secondary | ICD-10-CM

## 2021-12-07 NOTE — Therapy (Addendum)
OUTPATIENT PHYSICAL THERAPY TREATMENT NOTE   Patient Name: Shelby Odonnell MRN: [id number] DOB:July 28, 1970, 51 y.o., female Today's Date: 12/07/2021  PCP: [facility] [place] REFERRING PROVIDER: Jerrol Banana, MD  END OF SESSION:   PT End of Session - 12/07/21 1533     Visit Number 2    Number of Visits 13    Date for PT Re-Evaluation 01/13/22    Authorization Type [employer], 60 combined PT/OT/Speech per year    Progress Note Due on Visit 10    PT Start Time 1500    PT Stop Time 1544    PT Time Calculation (min) 44 min    Activity Tolerance Patient tolerated treatment well;Patient limited by pain    Behavior During Therapy [facility] for tasks assessed/performed              Past Medical History:  Diagnosis Date   Abnormal mammogram 2010   Colon polyp    Depression    Hyperlipidemia    Ovarian cyst    Past Surgical History:  Procedure Laterality Date   CESAREAN SECTION     COLONOSCOPY  2009, 05/2014   repeat due in 5 yrs at [facility]   Patient Active Problem List   Diagnosis Date Noted   Cervical high risk human papillomavirus (HPV) DNA test positive 11/02/2021   Fibromyalgia 09/24/2021   Rotator cuff impingement syndrome 09/10/2021   Cervical spondylosis 09/10/2021   Family history of ovarian cancer 07/11/2018   Polyp of colon 07/11/2018   Tobacco use disorder 07/29/2016   Alcohol use disorder, mild, abuse 07/29/2016   Severe recurrent major depression without psychotic features (HCC) 07/27/2016   Overdose of antidepressant, intentional self-harm, initial encounter (HCC) 07/27/2016    REFERRING DIAG: A21.308 (ICD-10-CM) - Cervical spondylosis M75.42 (ICD-10-CM) - Rotator cuff impingement syndrome of left shoulder M75.41 (ICD-10-CM) - Rotator cuff impingement syndrome of right shoulder   THERAPY DIAG:  Chronic right shoulder pain  Chronic left shoulder pain  Cervicalgia  Muscle weakness (generalized)  Rationale for Evaluation and Treatment  Rehabilitation  PERTINENT HISTORY: Patient is a 51 year old female with diagnosis of fibromyalgia from referring provider with referral for cervical spondylosis and bilateral rotator cuff impingement syndrome. Patient reports that subacromial injections recently have notably helped. Patient reports over this past winter she was caring for sick dog and had to lift and move 70-lb dog. Atraumatic onset. She reports her symptoms began in her R shoulder; she states she then began to have notable pain in her L shoulder. Patient reports making changes to her home to limit heavy lifting with her dog. Patient reports pain along ACJ region along both shoulders. Pt reports pain with sleeping with scapular elevation in lying. Pt reports difficulty abducting greater than 90 degrees. Patient reports some tightness in posterior cervical spine, but she does not feel pain in her neck. Patient reports she is generally sedentary and is mainly active when walking her dog 4x/day. Pt quit smoking in March 2023. Patient reports improved sleep quality following her recent subacromial injections - she feels these helped significantly.   PRECAUTIONS: None   OBJECTIVE: (objective measures completed at initial evaluation unless otherwise dated)   Patient Surveys  FOTO: 45 / goal score of 84   Cognition Patient is oriented to person, place, and time.  Recent memory is intact.  Remote memory is intact.  Attention span and concentration are intact.  Expressive speech is intact.  Patient's fund of knowledge is within normal limits for educational level.  Gross Musculoskeletal Assessment Tremor: None Bulk: Normal Tone: Normal     Posture Rounded shoulders, self-selected kyphotic sitting and standing posture; forward head   Cervical Screen AROM: WFL and painless with overpressure in all planes Spurlings A (ipsilateral lateral flexion/axial compression): R: Negative L: Negative Cervical  compression/distraction: Negative     AROM       AROM (Normal range in degrees) AROM 12/05/2021  Cervical  Flexion (50) WNL  Extension (80) WNL  Right lateral flexion (45) WNL  Left lateral flexion (45) WNL  Right rotation (85) WNL  Left rotation (85) WNL     Right Left  Shoulder      Flexion 133 132  Extension 55 55  Abduction 85 85  External Rotation 73 80  Internal Rotation      Hands Behind Head T3 T3  Hands Behind Back S2 L5         Elbow      Flexion WNL WNL  Extension WNL WNL  Pronation WNL WNL  Supination WNL WNL  (* = pain; Blank rows = not tested)       UE MMT:   MMT (out of 5) Right 12/05/2021 Left 12/05/2021  Cervical (isometric)  Flexion WNL  Extension WNL  Lateral Flexion WNL WNL  Rotation WNL WNL         Shoulder   Flexion 4-* 4-*  Extension      Abduction 3+* 3+*  External rotation 4 4*  Internal rotation 4* (R UT) 4  Horizontal abduction      Horizontal adduction      Lower Trapezius      Rhomboids             Elbow  Flexion 5 5  Extension 4-* 5  Pronation      Supination             (* = pain; Blank rows = not tested)     Sensation Deferred     Palpation   Location LEFT  RIGHT           Subocciptials      Cervical paraspinals      Upper Trapezius      Levator Scapulae      Rhomboid Major/Minor      Sternoclavicular joint      Acromioclavicular joint      Coracoid process      Long head of biceps 1 2  Supraspinatus      Infraspinatus      Subscapularis      Teres Minor      Teres Major      Pectoralis Major 1  1   Pectoralis Minor      Anterior Deltoid 1 1  Lateral Deltoid      Posterior Deltoid      Latissimus Dorsi      Sternocleidomastoid      (Blank rows = not tested) Graded on 0-4 scale (0 = no pain, 1 = pain, 2 = pain with wincing/grimacing/flinching, 3 = pain with withdrawal, 4 = unwilling to allow palpation), (Blank rows = not tested)       SPECIAL TESTS Rotator Cuff  Drop Arm Test:  Negative Painful Arc (Pain from 60 to 120 degrees scaption): Positive Infraspinatus Muscle Test: Positive Empty Can: R Positive, L Positive  If all 3 tests positive, the probability of a full-thickness rotator cuff tear is 91%   Subacromial Impingement Hawkins-Kennedy: Positive Neer (Block scapula, PROM flexion): Positive Painful  Arc (Pain from 60 to 120 degrees scaption): Positive External Rotation Resistance: Positive Horizontal Adduction: Not examined Scapular Assist: Negative       TODAY'S TREATMENT   SUBJECTIVE: Patient reports in addition to aggravating factors reported last visit, pt reports difficulty with donning shirt and drying off with towel. Patient reports no other major changes at arrival.   PAIN:  Are you having pain? No pain at rest     Manual Therapy - for symptom modulation, soft tissue sensitivity and mobility, joint mobility, ROM   Performed in supine:  (Bilateral) Glenohumeral joint mobilization, posterior and inferior gr II for pain control and gr III for improved GHJ mobility Bilateral shoulder PROM within pt tolerance to assess for motion loss  (Bilateral) STM anterior deltoid and biceps bilaterally    Therapeutic Exercise - for improved soft tissue flexibility and extensibility as needed for ROM  Bilateral wand ER, alternating R/L; 2x10  Scapular retraction; 2x10, 5 sec, seated Serratus slide with foam roller; 1x8   Patient education: discussed activity modification (avoidance of repetitive movement through painful arc), good posture when working on computer, and initiated home exercise program with initial [employer] printout provided.         PATIENT EDUCATION:  Education details: Plan of care, see above for patient education details   Person educated: Patient Education method: Explanation Education comprehension: verbalized understanding     HOME EXERCISE PROGRAM: Access Code [phone]    ASSESSMENT:   CLINICAL  IMPRESSION: Patient demonstrates [facility] forward flexion PROM of bilateral shoulders at this time s/p subacromial injections. She has ongoing significant shoulder complex abduction motion loss actively and passively. Pt has notable sensitivity along bilateral bicipital groove and pec major with direct palpation. Pt has ROM limitation with serratus slide with pt having motion restriction at 140 deg forward flexion, but no reproduction of primary pain in this plane of motion. Updated patient's HEP for external rotation AAROM within tolerable range, periscapular isometrics, and serratus slide at wall with forward elevation only as tolerated. Patient has remaining deficits in decreased ROM, decreased strength, hypomobility, impaired UE functional use, postural dysfunction, and pain. These impairments are limiting patient from meal prep, cleaning, laundry, and driving. Patient will benefit from continued skilled PT to address above deficits and improve overall function and quality of life.   REHAB POTENTIAL: Good   CLINICAL DECISION MAKING: Evolving/moderate complexity   EVALUATION COMPLEXITY: Moderate       GOALS: Goals reviewed with patient? Yes   SHORT TERM GOALS: Target date: 01/02/2022   Pt will be independent with HEP to improve strength and decrease neck pain to improve pain-free function at home and work. Baseline: 12/02/21: Formal HEP to be initiated on visit # 2.  Goal status: INITIAL     LONG TERM GOALS: Target date: 01/30/2022   Pt will increase FOTO to at least 67 to demonstrate significant improvement in function at home and work related to neck pain  Baseline: 12/02/21: FOTO 53 Goal status: INITIAL   2. Patient will have shoulder flexion and abduction AROM to 150 or greater without reproduction of pain indicative of ability to perform overhead activity and functional reaching as needed for self-care ADLs and household tasks Baseline: 12/02/21: Shoulder AROM R/L Flexion 133/132,  85/85 Goal status: INITIAL   3. Patient will be able to complete simulation for lifting her dog from one surface to another by moving box with modified grip from standard chair to adjacent treatment table without reproduction of pain and sound lifting  technique Baseline: 12/02/21: Significant limitation with lifting, increase in pain with assisting her 70-lb dog Goal status: INITIAL   4. Pt will increase strength of bilateral shoulder flexion, ABD, ER, IR to at least 4+ or greater MMT grade in order to demonstrate improvement in strength and function          Baseline: 12/02/21: Strength 3+ to 4 with pain reproduction with MMTs (see chart above for specifics).   Goal status: INITIAL     PLAN: PT FREQUENCY: 1-2x/week   PT DURATION: 6 weeks   PLANNED INTERVENTIONS: Therapeutic exercises, Therapeutic activity, Neuromuscular re-education, Patient/Family education, Joint manipulation, Joint mobilization, Dry Needling, Electrical stimulation, Spinal manipulation, Spinal mobilization, Cryotherapy, Moist heat, Traction, and Manual therapy   PLAN FOR NEXT SESSION: Continue with manual therapy to improve pain with elevation of bilateral shoulders, exercises to gradually introduced increased shoulder elevation ROM without repetitively lifting into painful arc, postural re-edu, periscapular isotonics and RTC strengthening       Consuela Mimes, PT, DPT #[id number]  Gertie Exon, PT 12/07/2021, 9:17 PM

## 2021-12-09 ENCOUNTER — Ambulatory Visit: Payer: Managed Care, Other (non HMO) | Attending: Family Medicine | Admitting: Physical Therapy

## 2021-12-09 DIAGNOSIS — M25511 Pain in right shoulder: Secondary | ICD-10-CM | POA: Diagnosis present

## 2021-12-09 DIAGNOSIS — M6281 Muscle weakness (generalized): Secondary | ICD-10-CM | POA: Diagnosis present

## 2021-12-09 DIAGNOSIS — G8929 Other chronic pain: Secondary | ICD-10-CM | POA: Diagnosis present

## 2021-12-09 DIAGNOSIS — M25512 Pain in left shoulder: Secondary | ICD-10-CM | POA: Diagnosis present

## 2021-12-09 DIAGNOSIS — M542 Cervicalgia: Secondary | ICD-10-CM | POA: Insufficient documentation

## 2021-12-09 NOTE — Therapy (Signed)
OUTPATIENT PHYSICAL THERAPY TREATMENT NOTE   Patient Name: AIMEE HELDMAN MRN: [id number] DOB:01/29/1971, 51 y.o., female Today's Date: 12/09/2021  PCP: [facility], [place] REFERRING PROVIDER: Jerrol Banana, MD  END OF SESSION:   PT End of Session - 12/09/21 1533     Visit Number 3    Number of Visits 13    Date for PT Re-Evaluation 01/13/22    Authorization Type [employer], 60 combined PT/OT/Speech per year    Progress Note Due on Visit 10    PT Start Time 1502    PT Stop Time 1545    PT Time Calculation (min) 43 min    Activity Tolerance Patient tolerated treatment well;Patient limited by pain    Behavior During Therapy [facility] for tasks assessed/performed             Past Medical History:  Diagnosis Date   Abnormal mammogram 2010   Colon polyp    Depression    Hyperlipidemia    Ovarian cyst    Past Surgical History:  Procedure Laterality Date   CESAREAN SECTION     COLONOSCOPY  2009, 05/2014   repeat due in 5 yrs at [facility]   Patient Active Problem List   Diagnosis Date Noted   Cervical high risk human papillomavirus (HPV) DNA test positive 11/02/2021   Fibromyalgia 09/24/2021   Rotator cuff impingement syndrome 09/10/2021   Cervical spondylosis 09/10/2021   Family history of ovarian cancer 07/11/2018   Polyp of colon 07/11/2018   Tobacco use disorder 07/29/2016   Alcohol use disorder, mild, abuse 07/29/2016   Severe recurrent major depression without psychotic features (HCC) 07/27/2016   Overdose of antidepressant, intentional self-harm, initial encounter (HCC) 07/27/2016        REFERRING DIAG: Z85.885 (ICD-10-CM) - Cervical spondylosis M75.42 (ICD-10-CM) - Rotator cuff impingement syndrome of left shoulder M75.41 (ICD-10-CM) - Rotator cuff impingement syndrome of right shoulder    THERAPY DIAG:  Chronic right shoulder pain   Chronic left shoulder pain   Cervicalgia   Muscle weakness (generalized)   Rationale for Evaluation and Treatment  Rehabilitation   PERTINENT HISTORY: Patient is a 51 year old female with diagnosis of fibromyalgia from referring provider with referral for cervical spondylosis and bilateral rotator cuff impingement syndrome. Patient reports that subacromial injections recently have notably helped. Patient reports over this past winter she was caring for sick dog and had to lift and move 70-lb dog. Atraumatic onset. She reports her symptoms began in her R shoulder; she states she then began to have notable pain in her L shoulder. Patient reports making changes to her home to limit heavy lifting with her dog. Patient reports pain along ACJ region along both shoulders. Pt reports pain with sleeping with scapular elevation in lying. Pt reports difficulty abducting greater than 90 degrees. Patient reports some tightness in posterior cervical spine, but she does not feel pain in her neck. Patient reports she is generally sedentary and is mainly active when walking her dog 4x/day. Pt quit smoking in March 2023. Patient reports improved sleep quality following her recent subacromial injections - she feels these helped significantly.    PRECAUTIONS: None     OBJECTIVE: (objective measures completed at initial evaluation unless otherwise dated)     Patient Surveys  FOTO: 35 / goal score of 59   Cognition Patient is oriented to person, place, and time.  Recent memory is intact.  Remote memory is intact.  Attention span and concentration are intact.  Expressive speech is  intact.  Patient's fund of knowledge is within normal limits for educational level.                        Gross Musculoskeletal Assessment Tremor: None Bulk: Normal Tone: Normal     Posture Rounded shoulders, self-selected kyphotic sitting and standing posture; forward head   Cervical Screen AROM: WFL and painless with overpressure in all planes Spurlings A (ipsilateral lateral flexion/axial compression): R: Negative L: Negative Cervical  compression/distraction: Negative     AROM          AROM (Normal range in degrees) AROM 12/05/2021  Cervical  Flexion (50) WNL  Extension (80) WNL  Right lateral flexion (45) WNL  Left lateral flexion (45) WNL  Right rotation (85) WNL  Left rotation (85) WNL     Right Left  Shoulder      Flexion 133 132  Extension 55 55  Abduction 85 85  External Rotation 73 80  Internal Rotation      Hands Behind Head T3 T3  Hands Behind Back S2 L5         Elbow      Flexion WNL WNL  Extension WNL WNL  Pronation WNL WNL  Supination WNL WNL  (* = pain; Blank rows = not tested)       UE MMT:   MMT (out of 5) Right 12/05/2021 Left 12/05/2021  Cervical (isometric)  Flexion WNL  Extension WNL  Lateral Flexion WNL WNL  Rotation WNL WNL         Shoulder   Flexion 4-* 4-*  Extension      Abduction 3+* 3+*  External rotation 4 4*  Internal rotation 4* (R UT) 4  Horizontal abduction      Horizontal adduction      Lower Trapezius      Rhomboids             Elbow  Flexion 5 5  Extension 4-* 5  Pronation      Supination             (* = pain; Blank rows = not tested)     Sensation Deferred     Palpation   Location LEFT  RIGHT           Subocciptials      Cervical paraspinals      Upper Trapezius      Levator Scapulae      Rhomboid Major/Minor      Sternoclavicular joint      Acromioclavicular joint      Coracoid process      Long head of biceps 1 2  Supraspinatus      Infraspinatus      Subscapularis      Teres Minor      Teres Major      Pectoralis Major 1  1   Pectoralis Minor      Anterior Deltoid 1 1  Lateral Deltoid      Posterior Deltoid      Latissimus Dorsi      Sternocleidomastoid      (Blank rows = not tested) Graded on 0-4 scale (0 = no pain, 1 = pain, 2 = pain with wincing/grimacing/flinching, 3 = pain with withdrawal, 4 = unwilling to allow palpation), (Blank rows = not tested)       SPECIAL TESTS Rotator Cuff  Drop Arm Test:  Negative Painful Arc (Pain from 60 to 120 degrees scaption): Positive  Infraspinatus Muscle Test: Positive Empty Can: R Positive, L Positive  If all 3 tests positive, the probability of a full-thickness rotator cuff tear is 91%   Subacromial Impingement Hawkins-Kennedy: Positive Neer (Block scapula, PROM flexion): Positive Painful Arc (Pain from 60 to 120 degrees scaption): Positive External Rotation Resistance: Positive Horizontal Adduction: Not examined Scapular Assist: Negative       TODAY'S TREATMENT    SUBJECTIVE: Patient reports pain with forward reaching and ER at 1/10 intensity, no pain at rest. Patient reports performing scap retractions, but no serratus slide or bilateral wand ER.    PAIN:  Are you having pain? No pain a trest         Manual Therapy - for symptom modulation, soft tissue sensitivity and mobility, joint mobility, ROM    Performed in supine:             (Bilateral) Glenohumeral joint mobilization, posterior and inferior gr II for pain control and gr III for improved GHJ mobility; 2x30 sec at 80 and 90 deg ABD Bilateral shoulder PROM within pt tolerance to assess for motion loss             (Bilateral) STM anterior deltoid, pectoralis major, and biceps bilaterally       Therapeutic Exercise - for improved soft tissue flexibility and extensibility as needed for ROM, serratus activation to improve scapulothoracic upward rotation with forward elevation   Bilateral wand ER, alternating R/L; 2x10   Shoulder ADD with theraband, AAROM for abduction; Red Tband linked to 3rd hook from top; 2x10 with each UE  Serratus slide with foam roller; 1x10        *not today* Scapular retraction; 2x10, 5 sec, seated     PATIENT EDUCATION:  Education details: Plan of care, see above for patient education details   Person educated: Patient Education method: Explanation Education comprehension: verbalized understanding     HOME EXERCISE PROGRAM: Access Code  [id number]     ASSESSMENT:   CLINICAL IMPRESSION: Patient demonstrates [facility] active forward flexion and shoulder complex abduction bilaterally without report of painful arc, but feeling "tight" with overhead shoulder elevation. She does have remaining shoulder mobility deficits with more pain behaviors with moving L shoulder versus R. Pt demonstrates improving passive and active-assisted ER. Pt needs continued work on gradual recovery of motion and will progress to strengthening as this improves. Patient has remaining deficits in decreased ROM, decreased strength, hypomobility, impaired UE functional use, postural dysfunction, and pain. These impairments are limiting patient from meal prep, cleaning, laundry, and driving. Patient will benefit from continued skilled PT to address above deficits and improve overall function and quality of life.   REHAB POTENTIAL: Good   CLINICAL DECISION MAKING: Evolving/moderate complexity   EVALUATION COMPLEXITY: Moderate         GOALS: Goals reviewed with patient? Yes   SHORT TERM GOALS: Target date: 01/02/2022   Pt will be independent with HEP to improve strength and decrease neck pain to improve pain-free function at home and work. Baseline: 12/02/21: Formal HEP to be initiated on visit # 2.  Goal status: INITIAL     LONG TERM GOALS: Target date: 01/30/2022   Pt will increase FOTO to at least 67 to demonstrate significant improvement in function at home and work related to neck pain  Baseline: 12/02/21: FOTO 53 Goal status: INITIAL   2. Patient will have shoulder flexion and abduction AROM to 150 or greater without reproduction of pain indicative of ability to perform overhead activity  and functional reaching as needed for self-care ADLs and household tasks Baseline: 12/02/21: Shoulder AROM R/L Flexion 133/132, 85/85 Goal status: INITIAL   3. Patient will be able to complete simulation for lifting her dog from one surface to another by moving box with  modified grip from standard chair to adjacent treatment table without reproduction of pain and sound lifting technique Baseline: 12/02/21: Significant limitation with lifting, increase in pain with assisting her 70-lb dog Goal status: INITIAL   4. Pt will increase strength of bilateral shoulder flexion, ABD, ER, IR to at least 4+ or greater MMT grade in order to demonstrate improvement in strength and function          Baseline: 12/02/21: Strength 3+ to 4 with pain reproduction with MMTs (see chart above for specifics).   Goal status: INITIAL     PLAN: PT FREQUENCY: 1-2x/week   PT DURATION: 6 weeks   PLANNED INTERVENTIONS: Therapeutic exercises, Therapeutic activity, Neuromuscular re-education, Patient/Family education, Joint manipulation, Joint mobilization, Dry Needling, Electrical stimulation, Spinal manipulation, Spinal mobilization, Cryotherapy, Moist heat, Traction, and Manual therapy   PLAN FOR NEXT SESSION: Continue with manual therapy to improve pain with elevation of bilateral shoulders, exercises to gradually introduced increased shoulder elevation ROM without repetitively lifting into painful arc, postural re-edu, periscapular isotonics and RTC strengthening      Consuela Mimes, PT, DPT #[id number]  Gertie Exon, PT 12/09/2021, 3:56 PM

## 2021-12-15 ENCOUNTER — Ambulatory Visit: Payer: Managed Care, Other (non HMO) | Admitting: Physical Therapy

## 2021-12-15 DIAGNOSIS — G8929 Other chronic pain: Secondary | ICD-10-CM

## 2021-12-15 DIAGNOSIS — M25511 Pain in right shoulder: Secondary | ICD-10-CM | POA: Diagnosis not present

## 2021-12-15 DIAGNOSIS — M6281 Muscle weakness (generalized): Secondary | ICD-10-CM

## 2021-12-15 DIAGNOSIS — M542 Cervicalgia: Secondary | ICD-10-CM

## 2021-12-15 NOTE — Therapy (Unsigned)
OUTPATIENT PHYSICAL THERAPY TREATMENT NOTE   Patient Name: Shelby Odonnell MRN: [id number] DOB:June 25, 1970, 51 y.o., female Today's Date: 12/15/2021  PCP: [facility] REFERRING PROVIDER: ***  END OF SESSION:    Past Medical History:  Diagnosis Date   Abnormal mammogram 2010   Colon polyp    Depression    Hyperlipidemia    Ovarian cyst    Past Surgical History:  Procedure Laterality Date   CESAREAN SECTION     COLONOSCOPY  2009, 05/2014   repeat due in 5 yrs at [facility]   Patient Active Problem List   Diagnosis Date Noted   Cervical high risk human papillomavirus (HPV) DNA test positive 11/02/2021   Fibromyalgia 09/24/2021   Rotator cuff impingement syndrome 09/10/2021   Cervical spondylosis 09/10/2021   Family history of ovarian cancer 07/11/2018   Polyp of colon 07/11/2018   Tobacco use disorder 07/29/2016   Alcohol use disorder, mild, abuse 07/29/2016   Severe recurrent major depression without psychotic features (HCC) 07/27/2016   Overdose of antidepressant, intentional self-harm, initial encounter (HCC) 07/27/2016        REFERRING DIAG: N82.956 (ICD-10-CM) - Cervical spondylosis M75.42 (ICD-10-CM) - Rotator cuff impingement syndrome of left shoulder M75.41 (ICD-10-CM) - Rotator cuff impingement syndrome of right shoulder    THERAPY DIAG:  Chronic right shoulder pain   Chronic left shoulder pain   Cervicalgia   Muscle weakness (generalized)   Rationale for Evaluation and Treatment Rehabilitation   PERTINENT HISTORY: Patient is a 51 year old female with diagnosis of fibromyalgia from referring provider with referral for cervical spondylosis and bilateral rotator cuff impingement syndrome. Patient reports that subacromial injections recently have notably helped. Patient reports over this past winter she was caring for sick dog and had to lift and move 70-lb dog. Atraumatic onset. She reports her symptoms began in her R shoulder; she states she then began to have notable pain in  her L shoulder. Patient reports making changes to her home to limit heavy lifting with her dog. Patient reports pain along ACJ region along both shoulders. Pt reports pain with sleeping with scapular elevation in lying. Pt reports difficulty abducting greater than 90 degrees. Patient reports some tightness in posterior cervical spine, but she does not feel pain in her neck. Patient reports she is generally sedentary and is mainly active when walking her dog 4x/day. Pt quit smoking in March 2023. Patient reports improved sleep quality following her recent subacromial injections - she feels these helped significantly.    PRECAUTIONS: None     OBJECTIVE: (objective measures completed at initial evaluation unless otherwise dated)     Patient Surveys  FOTO: 55 / goal score of 40   Cognition Patient is oriented to person, place, and time.  Recent memory is intact.  Remote memory is intact.  Attention span and concentration are intact.  Expressive speech is intact.  Patient's fund of knowledge is within normal limits for educational level.                        Gross Musculoskeletal Assessment Tremor: None Bulk: Normal Tone: Normal     Posture Rounded shoulders, self-selected kyphotic sitting and standing posture; forward head   Cervical Screen AROM: WFL and painless with overpressure in all planes Spurlings A (ipsilateral lateral flexion/axial compression): R: Negative L: Negative Cervical compression/distraction: Negative     AROM          AROM (Normal range in degrees) AROM 12/05/2021  Cervical  Flexion (  50) WNL  Extension (80) WNL  Right lateral flexion (45) WNL  Left lateral flexion (45) WNL  Right rotation (85) WNL  Left rotation (85) WNL     Right Left  Shoulder      Flexion 133 132  Extension 55 55  Abduction 85 85  External Rotation 73 80  Internal Rotation      Hands Behind Head T3 T3  Hands Behind Back S2 L5         Elbow      Flexion WNL WNL  Extension  WNL WNL  Pronation WNL WNL  Supination WNL WNL  (* = pain; Blank rows = not tested)       UE MMT:   MMT (out of 5) Right 12/05/2021 Left 12/05/2021  Cervical (isometric)  Flexion WNL  Extension WNL  Lateral Flexion WNL WNL  Rotation WNL WNL         Shoulder   Flexion 4-* 4-*  Extension      Abduction 3+* 3+*  External rotation 4 4*  Internal rotation 4* (R UT) 4  Horizontal abduction      Horizontal adduction      Lower Trapezius      Rhomboids             Elbow  Flexion 5 5  Extension 4-* 5  Pronation      Supination             (* = pain; Blank rows = not tested)     Sensation Deferred     Palpation   Location LEFT  RIGHT           Subocciptials      Cervical paraspinals      Upper Trapezius      Levator Scapulae      Rhomboid Major/Minor      Sternoclavicular joint      Acromioclavicular joint      Coracoid process      Long head of biceps 1 2  Supraspinatus      Infraspinatus      Subscapularis      Teres Minor      Teres Major      Pectoralis Major 1  1   Pectoralis Minor      Anterior Deltoid 1 1  Lateral Deltoid      Posterior Deltoid      Latissimus Dorsi      Sternocleidomastoid      (Blank rows = not tested) Graded on 0-4 scale (0 = no pain, 1 = pain, 2 = pain with wincing/grimacing/flinching, 3 = pain with withdrawal, 4 = unwilling to allow palpation), (Blank rows = not tested)       SPECIAL TESTS Rotator Cuff  Drop Arm Test: Negative Painful Arc (Pain from 60 to 120 degrees scaption): Positive Infraspinatus Muscle Test: Positive Empty Can: R Positive, L Positive  If all 3 tests positive, the probability of a full-thickness rotator cuff tear is 91%   Subacromial Impingement Hawkins-Kennedy: Positive Neer (Block scapula, PROM flexion): Positive Painful Arc (Pain from 60 to 120 degrees scaption): Positive External Rotation Resistance: Positive Horizontal Adduction: Not examined Scapular Assist: Negative       TODAY'S  TREATMENT    SUBJECTIVE: Patient reports no pain at arrival. She reports refraining from repetitive overhead activity. Patient reports compliance with her HEP. Patient reports improved ability cross her arms.    PAIN:  Are you having pain? No pain a trest  Manual Therapy - for symptom modulation, soft tissue sensitivity and mobility, joint mobility, ROM    Performed in supine:             (Bilateral) Glenohumeral joint mobilization, posterior and inferior gr II for pain control and gr III for improved GHJ mobility; 2x30 sec at 80 and 90 deg ABD Bilateral shoulder PROM within pt tolerance to assess for motion loss             (Bilateral) STM anterior deltoid, pectoralis major, and biceps bilaterally       Therapeutic Exercise - for improved soft tissue flexibility and extensibility as needed for ROM, serratus activation to improve scapulothoracic upward rotation with forward elevation   Serratus slide with foam roller; 2x10   Tband high row, Red Tband; 2x10   Shoulder ADD with theraband, AAROM for abduction; Red Tband linked to 3rd hook from top; 2x10 with each UE           *not today* Bilateral wand ER, alternating R/L; 2x10  Scapular retraction; 2x10, 5 sec, seated     PATIENT EDUCATION:  Education details: Plan of care, see above for patient education details   Person educated: Patient Education method: Explanation Education comprehension: verbalized understanding     HOME EXERCISE PROGRAM: Access Code [id number]     ASSESSMENT:   CLINICAL IMPRESSION: Patient demonstrates [facility] active forward flexion and shoulder complex abduction bilaterally without report of painful arc, but feeling "tight" with overhead shoulder elevation. She does have remaining shoulder mobility deficits with more pain behaviors with moving L shoulder versus R. Pt demonstrates improving passive and active-assisted ER. Pt needs continued work on gradual recovery of motion and will progress  to strengthening as this improves. Patient has remaining deficits in decreased ROM, decreased strength, hypomobility, impaired UE functional use, postural dysfunction, and pain. These impairments are limiting patient from meal prep, cleaning, laundry, and driving. Patient will benefit from continued skilled PT to address above deficits and improve overall function and quality of life.   REHAB POTENTIAL: Good   CLINICAL DECISION MAKING: Evolving/moderate complexity   EVALUATION COMPLEXITY: Moderate         GOALS: Goals reviewed with patient? Yes   SHORT TERM GOALS: Target date: 01/02/2022   Pt will be independent with HEP to improve strength and decrease neck pain to improve pain-free function at home and work. Baseline: 12/02/21: Formal HEP to be initiated on visit # 2.  Goal status: INITIAL     LONG TERM GOALS: Target date: 01/30/2022   Pt will increase FOTO to at least 67 to demonstrate significant improvement in function at home and work related to neck pain  Baseline: 12/02/21: FOTO 53 Goal status: INITIAL   2. Patient will have shoulder flexion and abduction AROM to 150 or greater without reproduction of pain indicative of ability to perform overhead activity and functional reaching as needed for self-care ADLs and household tasks Baseline: 12/02/21: Shoulder AROM R/L Flexion 133/132, 85/85 Goal status: INITIAL   3. Patient will be able to complete simulation for lifting her dog from one surface to another by moving box with modified grip from standard chair to adjacent treatment table without reproduction of pain and sound lifting technique Baseline: 12/02/21: Significant limitation with lifting, increase in pain with assisting her 70-lb dog Goal status: INITIAL   4. Pt will increase strength of bilateral shoulder flexion, ABD, ER, IR to at least 4+ or greater MMT grade in order to demonstrate improvement in strength and  function          Baseline: 12/02/21: Strength 3+ to 4 with  pain reproduction with MMTs (see chart above for specifics).   Goal status: INITIAL     PLAN: PT FREQUENCY: 1-2x/week   PT DURATION: 6 weeks   PLANNED INTERVENTIONS: Therapeutic exercises, Therapeutic activity, Neuromuscular re-education, Patient/Family education, Joint manipulation, Joint mobilization, Dry Needling, Electrical stimulation, Spinal manipulation, Spinal mobilization, Cryotherapy, Moist heat, Traction, and Manual therapy   PLAN FOR NEXT SESSION: Continue with manual therapy to improve pain with elevation of bilateral shoulders, exercises to gradually introduced increased shoulder elevation ROM without repetitively lifting into painful arc, postural re-edu, periscapular isotonics and RTC strengthening     Gertie Exon, PT 12/15/2021, 4:05 PM

## 2021-12-16 NOTE — Progress Notes (Unsigned)
Shelby Odonnell, [facility]   No chief complaint on file.   HPI:      Ms. Shelby Odonnell is a 51 y.o. [id number] whose LMP was No LMP recorded. Patient is premenopausal., presents today for trich test of cure from 6/23. Treated with flagyl.     Patient Active Problem List   Diagnosis Date Noted   Cervical high risk human papillomavirus (HPV) DNA test positive 11/02/2021   Fibromyalgia 09/24/2021   Rotator cuff impingement syndrome 09/10/2021   Cervical spondylosis 09/10/2021   Family history of ovarian cancer 07/11/2018   Polyp of colon 07/11/2018   Tobacco use disorder 07/29/2016   Alcohol use disorder, mild, abuse 07/29/2016   Severe recurrent major depression without psychotic features (HCC) 07/27/2016   Overdose of antidepressant, intentional self-harm, initial encounter (HCC) 07/27/2016    Past Surgical History:  Procedure Laterality Date   CESAREAN SECTION     COLONOSCOPY  2009, 05/2014   repeat due in 5 yrs at [facility]    Family History  Problem Relation Age of Onset   Colon cancer Father 39   Bladder Cancer Maternal Grandmother        90s   Ovarian cancer Maternal Grandmother        53s    Social History   Socioeconomic History   Marital status: Married    Spouse name: Not on file   Number of children: Not on file   Years of education: Not on file   Highest education level: Not on file  Occupational History   Not on file  Tobacco Use   Smoking status: Former    Packs/day: 1.00    Types: Cigarettes    Quit date: 07/08/2021    Years since quitting: 0.4   Smokeless tobacco: Never  Vaping Use   Vaping Use: Never used  Substance and Sexual Activity   Alcohol use: Yes   Drug use: Not Currently    Types: Marijuana   Sexual activity: Yes    Birth control/protection: None  Other Topics Concern   Not on file  Social History Narrative   Not on file   Social Determinants of Health   Financial Resource Strain: Low Risk  (09/10/2021)   Overall Financial  Resource Strain (CARDIA)    Difficulty of Paying Living Expenses: Not hard at all  Food Insecurity: No Food Insecurity (09/10/2021)   Hunger Vital Sign    Worried About Running Out of Food in the Last Year: Never true    Ran Out of Food in the Last Year: Never true  Transportation Needs: No Transportation Needs (09/10/2021)   PRAPARE - Administrator, Civil Service (Medical): No    Lack of Transportation (Non-Medical): No  Physical Activity: Not on file  Stress: Not on file  Social Connections: Not on file  Intimate Partner Violence: Not At Risk (09/10/2021)   Humiliation, Afraid, Rape, and Kick questionnaire    Fear of Current or Ex-Partner: No    Emotionally Abused: No    Physically Abused: No    Sexually Abused: No    Outpatient Medications Prior to Visit  Medication Sig Dispense Refill   DULoxetine (CYMBALTA) 30 MG capsule Take 1 capsule (30 mg total) by mouth every evening for 7 days, THEN 2 capsules (60 mg total) every evening. 173 capsule 0   hydrOXYzine (ATARAX/VISTARIL) 25 MG tablet      meloxicam (MOBIC) 15 MG tablet Take 1 tablet (15 mg total) by mouth daily  for 14 days, THEN 1 tablet (15 mg total) daily as needed for up to 14 days for pain. 30 tablet 0   Na Sulfate-K Sulfate-Mg Sulf 17.5-3.13-1.6 GM/177ML SOLN      simvastatin (ZOCOR) 20 MG tablet Take 20 mg by mouth daily.     No facility-administered medications prior to visit.      ROS:  Review of Systems BREAST: No symptoms   OBJECTIVE:   Vitals:  There were no vitals taken for this visit.  Physical Exam  Results: No results found for this or any previous visit (from the past 24 hour(s)).   Assessment/Plan: No diagnosis found.    No orders of the defined types were placed in this encounter.     No follow-ups on file.  [name] B. [name], PA-C 12/16/2021 7:59 PM

## 2021-12-17 ENCOUNTER — Ambulatory Visit: Payer: Managed Care, Other (non HMO) | Admitting: Physical Therapy

## 2021-12-17 ENCOUNTER — Ambulatory Visit (INDEPENDENT_AMBULATORY_CARE_PROVIDER_SITE_OTHER): Payer: Managed Care, Other (non HMO) | Admitting: Obstetrics and Gynecology

## 2021-12-17 ENCOUNTER — Encounter: Payer: Self-pay | Admitting: Obstetrics and Gynecology

## 2021-12-17 ENCOUNTER — Encounter: Payer: Self-pay | Admitting: Physical Therapy

## 2021-12-17 VITALS — BP 110/70 | Ht 65.0 in | Wt 183.0 lb

## 2021-12-17 DIAGNOSIS — M25511 Pain in right shoulder: Secondary | ICD-10-CM | POA: Diagnosis not present

## 2021-12-17 DIAGNOSIS — N950 Postmenopausal bleeding: Secondary | ICD-10-CM | POA: Diagnosis not present

## 2021-12-17 DIAGNOSIS — M6281 Muscle weakness (generalized): Secondary | ICD-10-CM

## 2021-12-17 DIAGNOSIS — G8929 Other chronic pain: Secondary | ICD-10-CM

## 2021-12-17 DIAGNOSIS — A599 Trichomoniasis, unspecified: Secondary | ICD-10-CM

## 2021-12-17 DIAGNOSIS — M542 Cervicalgia: Secondary | ICD-10-CM

## 2021-12-17 NOTE — Therapy (Signed)
OUTPATIENT PHYSICAL THERAPY TREATMENT NOTE   Patient Name: Shelby Odonnell MRN: [id number] DOB:February 03, 1971, 51 y.o., female Today's Date: 12/19/2021  PCP: [facility] [place] REFERRING PROVIDER: Jerrol Banana, MD  END OF SESSION:   PT End of Session - 12/19/21 1959     Visit Number 5    Number of Visits 13    Date for PT Re-Evaluation 01/13/22    Authorization Type [employer], 60 combined PT/OT/Speech per year    Progress Note Due on Visit 10    PT Start Time 1608    PT Stop Time 1650    PT Time Calculation (min) 42 min    Activity Tolerance Patient tolerated treatment well;Patient limited by pain    Behavior During Therapy [facility] for tasks assessed/performed             Past Medical History:  Diagnosis Date   Abnormal mammogram 2010   Colon polyp    Depression    Hyperlipidemia    Ovarian cyst    Past Surgical History:  Procedure Laterality Date   CESAREAN SECTION     COLONOSCOPY  2009, 05/2014   repeat due in 5 yrs at [facility]   Patient Active Problem List   Diagnosis Date Noted   Trichomoniasis 12/17/2021   Cervical high risk human papillomavirus (HPV) DNA test positive 11/02/2021   Fibromyalgia 09/24/2021   Rotator cuff impingement syndrome 09/10/2021   Cervical spondylosis 09/10/2021   Family history of ovarian cancer 07/11/2018   Polyp of colon 07/11/2018   Tobacco use disorder 07/29/2016   Alcohol use disorder, mild, abuse 07/29/2016   Severe recurrent major depression without psychotic features (HCC) 07/27/2016   Overdose of antidepressant, intentional self-harm, initial encounter (HCC) 07/27/2016      REFERRING DIAG: J85.631 (ICD-10-CM) - Cervical spondylosis M75.42 (ICD-10-CM) - Rotator cuff impingement syndrome of left shoulder M75.41 (ICD-10-CM) - Rotator cuff impingement syndrome of right shoulder    THERAPY DIAG:  Chronic right shoulder pain   Chronic left shoulder pain   Cervicalgia   Muscle weakness (generalized)   Rationale for Evaluation  and Treatment Rehabilitation   PERTINENT HISTORY: Patient is a 51 year old female with diagnosis of fibromyalgia from referring provider with referral for cervical spondylosis and bilateral rotator cuff impingement syndrome. Patient reports that subacromial injections recently have notably helped. Patient reports over this past winter she was caring for sick dog and had to lift and move 70-lb dog. Atraumatic onset. She reports her symptoms began in her R shoulder; she states she then began to have notable pain in her L shoulder. Patient reports making changes to her home to limit heavy lifting with her dog. Patient reports pain along ACJ region along both shoulders. Pt reports pain with sleeping with scapular elevation in lying. Pt reports difficulty abducting greater than 90 degrees. Patient reports some tightness in posterior cervical spine, but she does not feel pain in her neck. Patient reports she is generally sedentary and is mainly active when walking her dog 4x/day. Pt quit smoking in March 2023. Patient reports improved sleep quality following her recent subacromial injections - she feels these helped significantly.    PRECAUTIONS: None     OBJECTIVE: (objective measures completed at initial evaluation unless otherwise dated)     Patient Surveys  FOTO: 80 / goal score of 66   Cognition Patient is oriented to person, place, and time.  Recent memory is intact.  Remote memory is intact.  Attention span and concentration are intact.  Expressive  speech is intact.  Patient's fund of knowledge is within normal limits for educational level.                        Gross Musculoskeletal Assessment Tremor: None Bulk: Normal Tone: Normal     Posture Rounded shoulders, self-selected kyphotic sitting and standing posture; forward head   Cervical Screen AROM: WFL and painless with overpressure in all planes Spurlings A (ipsilateral lateral flexion/axial compression): R: Negative L:  Negative Cervical compression/distraction: Negative     AROM          AROM (Normal range in degrees) AROM 12/05/2021  Cervical  Flexion (50) WNL  Extension (80) WNL  Right lateral flexion (45) WNL  Left lateral flexion (45) WNL  Right rotation (85) WNL  Left rotation (85) WNL     Right Left  Shoulder      Flexion 133 132  Extension 55 55  Abduction 85 85  External Rotation 73 80  Internal Rotation      Hands Behind Head T3 T3  Hands Behind Back S2 L5         Elbow      Flexion WNL WNL  Extension WNL WNL  Pronation WNL WNL  Supination WNL WNL  (* = pain; Blank rows = not tested)       UE MMT:   MMT (out of 5) Right 12/05/2021 Left 12/05/2021  Cervical (isometric)  Flexion WNL  Extension WNL  Lateral Flexion WNL WNL  Rotation WNL WNL         Shoulder   Flexion 4-* 4-*  Extension      Abduction 3+* 3+*  External rotation 4 4*  Internal rotation 4* (R UT) 4  Horizontal abduction      Horizontal adduction      Lower Trapezius      Rhomboids             Elbow  Flexion 5 5  Extension 4-* 5  Pronation      Supination             (* = pain; Blank rows = not tested)     Sensation Deferred     Palpation   Location LEFT  RIGHT           Subocciptials      Cervical paraspinals      Upper Trapezius      Levator Scapulae      Rhomboid Major/Minor      Sternoclavicular joint      Acromioclavicular joint      Coracoid process      Long head of biceps 1 2  Supraspinatus      Infraspinatus      Subscapularis      Teres Minor      Teres Major      Pectoralis Major 1  1   Pectoralis Minor      Anterior Deltoid 1 1  Lateral Deltoid      Posterior Deltoid      Latissimus Dorsi      Sternocleidomastoid      (Blank rows = not tested) Graded on 0-4 scale (0 = no pain, 1 = pain, 2 = pain with wincing/grimacing/flinching, 3 = pain with withdrawal, 4 = unwilling to allow palpation), (Blank rows = not tested)       SPECIAL TESTS Rotator Cuff  Drop  Arm Test: Negative Painful Arc (Pain from 60 to 120 degrees  scaption): Positive Infraspinatus Muscle Test: Positive Empty Can: R Positive, L Positive  If all 3 tests positive, the probability of a full-thickness rotator cuff tear is 91%   Subacromial Impingement Hawkins-Kennedy: Positive Neer (Block scapula, PROM flexion): Positive Painful Arc (Pain from 60 to 120 degrees scaption): Positive External Rotation Resistance: Positive Horizontal Adduction: Not examined Scapular Assist: Negative       TODAY'S TREATMENT    SUBJECTIVE: Patient reports no pain at arrival. She reports doing better with forward reaching, though movements requiring ER/abduction are still significantly limited.    PAIN:  Are you having pain? No pain a trest         Manual Therapy - for symptom modulation, soft tissue sensitivity and mobility, joint mobility, ROM    Performed in supine:             (Bilateral) Glenohumeral joint mobilization, posterior and inferior gr II for pain control and gr III for improved GHJ mobility; 2x30 sec at 80 and 90 deg ABD Bilateral shoulder PROM within pt tolerance to assess for motion loss             (Bilateral) STM/DTM anterior deltoid, pectoralis major, and biceps bilaterally       Therapeutic Exercise - for improved soft tissue flexibility and extensibility as needed for ROM, serratus activation to improve scapulothoracic upward rotation with forward elevation   Wand ER AAROM with shoulder in 60 deg abduction, in supine; 2x10, bilateral  Serratus slide with foam roller; 2x10    Shoulder ADD with theraband, AAROM for abduction; Red Tband linked to 3rd hook from top; 2x10 with each UE    Tband row in standing; with Green Tband, with verbal cueing for scapular retraction/depression; 1x10 - completed for demonstration to carry over into HEP    Pt edu: HEP update and review    *not today* Tband high row, Red Tband; 2x10 Bilateral wand ER, alternating R/L; 2x10   Scapular retraction; 2x10, 5 sec, seated     PATIENT EDUCATION:  Education details: Plan of care, see above for patient education details   Person educated: Patient Education method: Explanation Education comprehension: verbalized understanding     HOME EXERCISE PROGRAM: Access Code [id number]     ASSESSMENT:   CLINICAL IMPRESSION: Patient is improving with bilateral forward elevation, but she is still limited with GHJ ER and shoulder complex abduction as needed for self-care activities and tasks requiring functional ER. Pt will be out of town next week. Her HEP was updated to allow for continued AAROM and postural re-education exercise outside of clinic. R shoulder complex abduction and ER are notably improving; progress on L side is modest. Patient has remaining deficits in decreased ROM, decreased strength, hypomobility, impaired UE functional use, postural dysfunction, and pain. These impairments are limiting patient from meal prep, cleaning, laundry, and driving. Patient will benefit from continued skilled PT to address above deficits and improve overall function and quality of life.   REHAB POTENTIAL: Good   CLINICAL DECISION MAKING: Evolving/moderate complexity   EVALUATION COMPLEXITY: Moderate         GOALS: Goals reviewed with patient? Yes   SHORT TERM GOALS: Target date: 01/02/2022   Pt will be independent with HEP to improve strength and decrease neck pain to improve pain-free function at home and work. Baseline: 12/02/21: Formal HEP to be initiated on visit # 2.  Goal status: INITIAL     LONG TERM GOALS: Target date: 01/30/2022   Pt will increase FOTO to  at least 67 to demonstrate significant improvement in function at home and work related to neck pain  Baseline: 12/02/21: FOTO 53 Goal status: INITIAL   2. Patient will have shoulder flexion and abduction AROM to 150 or greater without reproduction of pain indicative of ability to perform overhead activity and  functional reaching as needed for self-care ADLs and household tasks Baseline: 12/02/21: Shoulder AROM R/L Flexion 133/132, 85/85 Goal status: INITIAL   3. Patient will be able to complete simulation for lifting her dog from one surface to another by moving box with modified grip from standard chair to adjacent treatment table without reproduction of pain and sound lifting technique Baseline: 12/02/21: Significant limitation with lifting, increase in pain with assisting her 70-lb dog Goal status: INITIAL   4. Pt will increase strength of bilateral shoulder flexion, ABD, ER, IR to at least 4+ or greater MMT grade in order to demonstrate improvement in strength and function          Baseline: 12/02/21: Strength 3+ to 4 with pain reproduction with MMTs (see chart above for specifics).   Goal status: INITIAL     PLAN: PT FREQUENCY: 1-2x/week   PT DURATION: 6 weeks   PLANNED INTERVENTIONS: Therapeutic exercises, Therapeutic activity, Neuromuscular re-education, Patient/Family education, Joint manipulation, Joint mobilization, Dry Needling, Electrical stimulation, Spinal manipulation, Spinal mobilization, Cryotherapy, Moist heat, Traction, and Manual therapy   PLAN FOR NEXT SESSION: Continue with manual therapy to improve pain with elevation of bilateral shoulders, exercises to gradually introduced increased shoulder elevation ROM without repetitively lifting into painful arc, postural re-edu, periscapular isotonics and RTC strengthening     Consuela Mimes, PT, DPT #[id number]  Gertie Exon, PT 12/19/2021, 8:00 PM

## 2021-12-19 ENCOUNTER — Encounter: Payer: Self-pay | Admitting: Physical Therapy

## 2021-12-19 LAB — CHLAMYDIA/GONOCOCCUS/TRICHOMONAS, NAA
Chlamydia by NAA: NEGATIVE
Gonococcus by NAA: NEGATIVE
Trich vag by NAA: NEGATIVE

## 2021-12-21 ENCOUNTER — Other Ambulatory Visit: Payer: Self-pay | Admitting: Family Medicine

## 2021-12-21 DIAGNOSIS — M7541 Impingement syndrome of right shoulder: Secondary | ICD-10-CM

## 2021-12-21 DIAGNOSIS — M47812 Spondylosis without myelopathy or radiculopathy, cervical region: Secondary | ICD-10-CM

## 2021-12-21 DIAGNOSIS — G8929 Other chronic pain: Secondary | ICD-10-CM

## 2021-12-21 DIAGNOSIS — M7542 Impingement syndrome of left shoulder: Secondary | ICD-10-CM

## 2021-12-21 NOTE — Telephone Encounter (Signed)
Requested medication (s) are due for refill today: yes  Requested medication (s) are on the active medication list: yes  Last refill:  11/24/21 #30/0  Future visit scheduled: yes  Notes to clinic:  Unable to refill per protocol due to failed labs, no updated results.      Requested Prescriptions  Pending Prescriptions Disp Refills   meloxicam (MOBIC) 15 MG tablet [Pharmacy Med Name: MELOXICAM 15MG TABLETS] 30 tablet 0    Sig: TAKE 1 TABLET BY MOUTH DAILY FOR 14 DAYS THEN AS NEEDED FOR UP TO 14 DAYS FOR PAIN     Analgesics:  COX2 Inhibitors Failed - 12/21/2021  3:33 AM      Failed - Manual Review: Labs are only required if the patient has taken medication for more than 8 weeks.      Failed - HGB in normal range and within 360 days    Hemoglobin  Date Value Ref Range Status  07/27/2016 12.1 12.0 - 16.0 g/dL Final         Failed - Cr in normal range and within 360 days    Creatinine, Ser  Date Value Ref Range Status  07/28/2016 0.61 0.44 - 1.00 mg/dL Final         Failed - HCT in normal range and within 360 days    HCT  Date Value Ref Range Status  07/27/2016 34.3 (L) 35.0 - 47.0 % Final         Failed - AST in normal range and within 360 days    AST  Date Value Ref Range Status  07/26/2016 26 15 - 41 U/L Final         Failed - ALT in normal range and within 360 days    ALT  Date Value Ref Range Status  07/26/2016 24 14 - 54 U/L Final         Failed - eGFR is 30 or above and within 360 days    GFR calc Af Amer  Date Value Ref Range Status  07/28/2016 >60 >60 mL/min Final    Comment:    (NOTE) The eGFR has been calculated using the CKD EPI equation. This calculation has not been validated in all clinical situations. eGFR's persistently <60 mL/min signify possible Chronic Kidney Disease.    GFR calc non Af Amer  Date Value Ref Range Status  07/28/2016 >60 >60 mL/min Final         Passed - Patient is not pregnant      Passed - Valid encounter within last  12 months    Recent Outpatient Visits           3 weeks ago Rotator cuff impingement syndrome of right shoulder   [facility] Montel Culver, MD   2 months ago [facility] Montel Culver, MD   3 months ago Rotator cuff impingement syndrome, unspecified laterality   [facility] Montel Culver, MD       Future Appointments             In 2 weeks Zigmund Daniel, Earley Abide, MD [facility], [facility]

## 2021-12-22 ENCOUNTER — Other Ambulatory Visit: Payer: Self-pay | Admitting: Family Medicine

## 2021-12-22 DIAGNOSIS — M62838 Other muscle spasm: Secondary | ICD-10-CM

## 2021-12-22 DIAGNOSIS — M797 Fibromyalgia: Secondary | ICD-10-CM

## 2021-12-22 DIAGNOSIS — M7540 Impingement syndrome of unspecified shoulder: Secondary | ICD-10-CM

## 2021-12-22 MED ORDER — DULOXETINE HCL 60 MG PO CPEP: 60.0000 mg | ORAL_CAPSULE | Freq: Every day | ORAL | 1 refills | Status: DC

## 2021-12-24 ENCOUNTER — Encounter: Payer: Self-pay | Admitting: *Deleted

## 2021-12-25 ENCOUNTER — Ambulatory Visit: Payer: Managed Care, Other (non HMO) | Admitting: Anesthesiology

## 2021-12-25 ENCOUNTER — Ambulatory Visit
Admission: RE | Admit: 2021-12-25 | Discharge: 2021-12-25 | Disposition: A | Discharge status: Home / Self Care | Payer: Managed Care, Other (non HMO) | Attending: Gastroenterology | Admitting: Gastroenterology

## 2021-12-25 ENCOUNTER — Encounter
Admission: RE | Disposition: A | Discharge status: Home / Self Care | Payer: Self-pay | Source: Home / Self Care | Attending: Gastroenterology

## 2021-12-25 ENCOUNTER — Encounter: Payer: Self-pay | Admitting: Gastroenterology

## 2021-12-25 DIAGNOSIS — K640 First degree hemorrhoids: Secondary | ICD-10-CM | POA: Insufficient documentation

## 2021-12-25 DIAGNOSIS — Z87891 Personal history of nicotine dependence: Secondary | ICD-10-CM | POA: Insufficient documentation

## 2021-12-25 DIAGNOSIS — M797 Fibromyalgia: Secondary | ICD-10-CM | POA: Insufficient documentation

## 2021-12-25 DIAGNOSIS — Z1211 Encounter for screening for malignant neoplasm of colon: Secondary | ICD-10-CM | POA: Insufficient documentation

## 2021-12-25 DIAGNOSIS — Z800 Family history of malignant neoplasm of digestive organs: Secondary | ICD-10-CM | POA: Diagnosis not present

## 2021-12-25 DIAGNOSIS — G709 Myoneural disorder, unspecified: Secondary | ICD-10-CM | POA: Insufficient documentation

## 2021-12-25 DIAGNOSIS — Z86010 Personal history of colonic polyps: Secondary | ICD-10-CM | POA: Insufficient documentation

## 2021-12-25 DIAGNOSIS — K621 Rectal polyp: Secondary | ICD-10-CM | POA: Diagnosis not present

## 2021-12-25 HISTORY — PX: COLONOSCOPY WITH PROPOFOL: SHX5780

## 2021-12-25 SURGERY — COLONOSCOPY WITH PROPOFOL
Anesthesia: General

## 2021-12-25 MED ORDER — PROPOFOL 10 MG/ML IV BOLUS
INTRAVENOUS | Status: DC | PRN
  Administered 2021-12-25: 100 mg via INTRAVENOUS
  Administered 2021-12-25: 130 ug/kg/min via INTRAVENOUS

## 2021-12-25 MED ORDER — PROPOFOL 10 MG/ML IV BOLUS
INTRAVENOUS | Status: AC
  Filled 2021-12-25: qty 40

## 2021-12-25 MED ORDER — LIDOCAINE HCL (PF) 2 % IJ SOLN
INTRAMUSCULAR | Status: AC
  Filled 2021-12-25: qty 5

## 2021-12-25 MED ORDER — SODIUM CHLORIDE 0.9 % IV SOLN
INTRAVENOUS | Status: DC
Start: 2021-12-25 — End: 2021-12-25
  Administered 2021-12-25: 20 mL/h via INTRAVENOUS

## 2021-12-25 MED ORDER — LIDOCAINE HCL (CARDIAC) PF 100 MG/5ML IV SOSY
PREFILLED_SYRINGE | INTRAVENOUS | Status: DC | PRN
  Administered 2021-12-25: 100 mg via INTRAVENOUS

## 2021-12-25 NOTE — Anesthesia Postprocedure Evaluation (Signed)
Anesthesia Post Note  Patient: Shelby Odonnell  Procedure(s) Performed: COLONOSCOPY WITH PROPOFOL  Patient location during evaluation: Endoscopy Anesthesia Type: General Level of consciousness: awake and alert Pain management: pain level controlled Vital Signs Assessment: post-procedure vital signs reviewed and stable Respiratory status: spontaneous breathing, nonlabored ventilation, respiratory function stable and patient connected to nasal cannula oxygen Cardiovascular status: blood pressure returned to baseline and stable Postop Assessment: no apparent nausea or vomiting Anesthetic complications: no   No notable events documented.   Last Vitals:  Vitals:   12/25/21 1140 12/25/21 1200  BP:    Pulse:    Resp:    Temp: (!) 35.8 C   SpO2:  100%    Last Pain:  Vitals:   12/25/21 1200  TempSrc:   PainSc: 0-No pain                 Cleda Mccreedy Milon Dethloff

## 2021-12-25 NOTE — Op Note (Signed)
[facility] Patient Name: Shelby Odonnell Procedure Date: 12/25/2021 10:50 AM MRN: [id number] Account #: [id number] Date of Birth: 1970-11-14 Admit Type: Outpatient Age: 51 Room: [facility] ENDO ROOM 3 Gender: Female Note Status: Finalized Instrument Name: Park Meo [id number] Procedure:             Colonoscopy Indications:           Surveillance: Personal history of adenomatous polyps                         on last colonoscopy 5 years ago, Family history of                         colon cancer in a first-degree relative before age 28                         years Providers:             Andrey Farmer MD, MD Referring MD:          Rosette Reveal md Medicines:             Monitored Anesthesia Care Complications:         No immediate complications. Estimated blood loss:                         Minimal. Procedure:             Pre-Anesthesia Assessment:                        - Prior to the procedure, a History and Physical was                         performed, and patient medications and allergies were                         reviewed. The patient is competent. The risks and                         benefits of the procedure and the sedation options and                         risks were discussed with the patient. All questions                         were answered and informed consent was obtained.                         Patient identification and proposed procedure were                         verified by the physician, the nurse, the                         anesthesiologist, the anesthetist and the technician                         in the endoscopy suite. Mental Status Examination:  alert and oriented. Airway Examination: normal                         oropharyngeal airway and neck mobility. Respiratory                         Examination: clear to auscultation. CV Examination:                         normal. Prophylactic  Antibiotics: The patient does not                         require prophylactic antibiotics. Prior                         Anticoagulants: The patient has taken no previous                         anticoagulant or antiplatelet agents. ASA Grade                         Assessment: II - A patient with mild systemic disease.                         After reviewing the risks and benefits, the patient                         was deemed in satisfactory condition to undergo the                         procedure. The anesthesia plan was to use monitored                         anesthesia care (MAC). Immediately prior to                         administration of medications, the patient was                         re-assessed for adequacy to receive sedatives. The                         heart rate, respiratory rate, oxygen saturations,                         blood pressure, adequacy of pulmonary ventilation, and                         response to care were monitored throughout the                         procedure. The physical status of the patient was                         re-assessed after the procedure.                        After obtaining informed consent, the colonoscope was  passed under direct vision. Throughout the procedure,                         the patient's blood pressure, pulse, and oxygen                         saturations were monitored continuously. The                         Colonoscope was introduced through the anus and                         advanced to the the cecum, identified by appendiceal                         orifice and ileocecal valve. The colonoscopy was                         performed without difficulty. The patient tolerated                         the procedure well. The quality of the bowel                         preparation was good. Findings:      The perianal and digital rectal examinations were normal.      A 3 mm polyp  was found in the rectum. The polyp was sessile. The polyp       was removed with a cold snare. Resection and retrieval were complete.       Estimated blood loss was minimal.      Internal hemorrhoids were found during retroflexion. The hemorrhoids       were Grade I (internal hemorrhoids that do not prolapse).      The exam was otherwise without abnormality on direct and retroflexion       views. Impression:            - One 3 mm polyp in the rectum, removed with a cold                         snare. Resected and retrieved.                        - Internal hemorrhoids.                        - The examination was otherwise normal on direct and                         retroflexion views. Recommendation:        - Discharge patient to home.                        - Resume previous diet.                        - Continue present medications.                        - Await pathology results.                        -  Repeat colonoscopy in 5 years for surveillance.                        - Return to referring physician as previously                         scheduled. Procedure Code(s):     --- Professional ---                        [phone], Colonoscopy, flexible; with removal of                         tumor(s), polyp(s), or other lesion(s) by snare                         technique Diagnosis Code(s):     --- Professional ---                        Z86.010, Personal history of colonic polyps                        K62.1, Rectal polyp                        K64.0, First degree hemorrhoids                        Z80.0, Family history of malignant neoplasm of                         digestive organs CPT copyright 2019 [employer]. All rights reserved. The codes documented in this report are preliminary and upon coder review may  be revised to meet current compliance requirements. Andrey Farmer MD, MD 12/25/2021 11:42:15 AM Number of Addenda: 0 Note Initiated On: 12/25/2021  10:50 AM Scope Withdrawal Time: 0 hours 11 minutes 48 seconds  Total Procedure Duration: 0 hours 18 minutes 31 seconds  Estimated Blood Loss:  Estimated blood loss was minimal.      [facility]

## 2021-12-25 NOTE — Interval H&P Note (Signed)
History and Physical Interval Note:  12/25/2021 11:06 AM  Shelby Odonnell  has presented today for surgery, with the diagnosis of personal history colon polyps.  The various methods of treatment have been discussed with the patient and family. After consideration of risks, benefits and other options for treatment, the patient has consented to  Procedure(s): COLONOSCOPY WITH PROPOFOL (N/A) as a surgical intervention.  The patient's history has been reviewed, patient examined, no change in status, stable for surgery.  I have reviewed the patient's chart and labs.  Questions were answered to the patient's satisfaction.     Regis Bill  Ok to proceed with colonoscopy

## 2021-12-25 NOTE — Anesthesia Preprocedure Evaluation (Signed)
Anesthesia Evaluation  Patient identified by MRN, date of birth, ID band Patient awake    Reviewed: Allergy & Precautions, NPO status , Patient's Chart, lab work & pertinent test results  Airway Mallampati: III  TM Distance: <3 FB Neck ROM: full    Dental  (+) Chipped   Pulmonary neg shortness of breath, former smoker,    Pulmonary exam normal        Cardiovascular Exercise Tolerance: Good (-) angina(-) Past MI and (-) DOE negative cardio ROS Normal cardiovascular exam     Neuro/Psych PSYCHIATRIC DISORDERS  Neuromuscular disease    GI/Hepatic negative GI ROS, Neg liver ROS, neg GERD  ,  Endo/Other  negative endocrine ROS  Renal/GU negative Renal ROS  negative genitourinary   Musculoskeletal   Abdominal   Peds  Hematology negative hematology ROS (+)   Anesthesia Other Findings Past Medical History: 2010: Abnormal mammogram No date: Colon polyp No date: Depression No date: Hyperlipidemia No date: Ovarian cyst  Past Surgical History: No date: CESAREAN SECTION 2009, 05/2014: COLONOSCOPY     Comment:  repeat due in 5 yrs at [facility]  BMI    Body Mass Index: 29.95 kg/m      Reproductive/Obstetrics negative OB ROS Patient reports no periods in the past 18 months                             Anesthesia Physical Anesthesia Plan  ASA: 3  Anesthesia Plan: General   Post-op Pain Management:    Induction: Intravenous  PONV Risk Score and Plan: Propofol infusion and TIVA  Airway Management Planned: Natural Airway and Nasal Cannula  Additional Equipment:   Intra-op Plan:   Post-operative Plan:   Informed Consent: I have reviewed the patients History and Physical, chart, labs and discussed the procedure including the risks, benefits and alternatives for the proposed anesthesia with the patient or authorized representative who has indicated his/her understanding and acceptance.      Dental Advisory Given  Plan Discussed with: Anesthesiologist, CRNA and Surgeon  Anesthesia Plan Comments: (Patient consented for risks of anesthesia including but not limited to:  - adverse reactions to medications - risk of airway placement if required - damage to eyes, teeth, lips or other oral mucosa - nerve damage due to positioning  - sore throat or hoarseness - Damage to heart, brain, nerves, lungs, other parts of body or loss of life  Patient voiced understanding.)        Anesthesia Quick Evaluation

## 2021-12-25 NOTE — Transfer of Care (Signed)
Immediate Anesthesia Transfer of Care Note  Patient: Shelby Odonnell  Procedure(s) Performed: COLONOSCOPY WITH PROPOFOL  Patient Location: PACU  Anesthesia Type:General  Level of Consciousness: awake  Airway & Oxygen Therapy: Patient Spontanous Breathing  Post-op Assessment: Report given to RN and Post -op Vital signs reviewed and stable  Post vital signs: Reviewed and stable  Last Vitals:  Vitals Value Taken Time  BP 120/62 12/25/21 1140  Temp 35.7 1140  Pulse 81 12/25/21 1140  Resp 24 12/25/21 1140  SpO2 98 % 12/25/21 1140  Vitals shown include unvalidated device data.  Last Pain:  Vitals:   12/25/21 1051  TempSrc: Temporal  PainSc: 0-No pain         Complications: No notable events documented.

## 2021-12-25 NOTE — H&P (Signed)
Outpatient short stay form Pre-procedure 12/25/2021  Regis Bill, MD  Primary Physician: [facility]  Reason for visit:  Surveillance  History of present illness:    51 y/o lady with history of fibromyalgia and family history of colon cancer in her father at the age of 37 here for surveillance colonoscopy. Last colonoscopy in 2016 with Ta's removed. No blood thinners. History of c-section.    Current Facility-Administered Medications:    0.9 %  sodium chloride infusion, , Intravenous, Continuous, Jazalynn Mireles, Rossie Muskrat, MD, Last Rate: 20 mL/hr at 12/25/21 1100, 20 mL/hr at 12/25/21 1100  Medications Prior to Admission  Medication Sig Dispense Refill Last Dose   DULoxetine (CYMBALTA) 60 MG capsule Take 1 capsule (60 mg total) by mouth daily. 90 capsule 1 12/24/2021   hydrOXYzine (ATARAX/VISTARIL) 25 MG tablet    12/24/2021   simvastatin (ZOCOR) 20 MG tablet Take 20 mg by mouth daily.   12/24/2021   Na Sulfate-K Sulfate-Mg Sulf 17.5-3.13-1.6 GM/177ML SOLN  (Patient not taking: Reported on 12/17/2021)        Allergies  Allergen Reactions   Fluoxetine Other (See Comments)    Other Reaction: OTHER REACTION, onSet: 09/03/2009   Tetracyclines & Related Rash     Past Medical History:  Diagnosis Date   Abnormal mammogram 2010   Colon polyp    Depression    Hyperlipidemia    Ovarian cyst     Review of systems:  Otherwise negative.    Physical Exam  Gen: Alert, oriented. Appears stated age.  HEENT: PERRLA. Lungs: No respiratory distress CV: RRR Abd: soft, benign, no masses Ext: No edema    Planned procedures: Proceed with colonoscopy. The patient understands the nature of the planned procedure, indications, risks, alternatives and potential complications including but not limited to bleeding, infection, perforation, damage to internal organs and possible oversedation/side effects from anesthesia. The patient agrees and gives consent to proceed.  Please refer  to procedure notes for findings, recommendations and patient disposition/instructions.     Regis Bill, MD [facility]

## 2021-12-28 LAB — SURGICAL PATHOLOGY

## 2021-12-30 ENCOUNTER — Encounter: Payer: Managed Care, Other (non HMO) | Admitting: Physical Therapy

## 2022-01-04 ENCOUNTER — Ambulatory Visit: Payer: Managed Care, Other (non HMO) | Admitting: Physical Therapy

## 2022-01-05 ENCOUNTER — Ambulatory Visit: Payer: Managed Care, Other (non HMO) | Admitting: Family Medicine

## 2022-01-06 ENCOUNTER — Encounter: Payer: Managed Care, Other (non HMO) | Admitting: Physical Therapy

## 2022-04-25 ENCOUNTER — Other Ambulatory Visit: Payer: Self-pay | Admitting: Family Medicine

## 2022-04-25 DIAGNOSIS — M797 Fibromyalgia: Secondary | ICD-10-CM

## 2022-04-25 DIAGNOSIS — M7540 Impingement syndrome of unspecified shoulder: Secondary | ICD-10-CM

## 2022-04-25 DIAGNOSIS — M62838 Other muscle spasm: Secondary | ICD-10-CM
